# Patient Record
Sex: Female | Born: 1980 | Race: White | Hispanic: No | Smoking: Current some day smoker
Health system: Southern US, Community
[De-identification: ages and names within clinical notes are randomized; demographics above are authoritative.]

## PROBLEM LIST (undated history)

## (undated) DIAGNOSIS — F1010 Alcohol abuse, uncomplicated: Secondary | ICD-10-CM

## (undated) DIAGNOSIS — F419 Anxiety disorder, unspecified: Secondary | ICD-10-CM

## (undated) DIAGNOSIS — F1910 Other psychoactive substance abuse, uncomplicated: Secondary | ICD-10-CM

## (undated) HISTORY — PX: TONSILLECTOMY: SUR1361

---

## 2000-06-01 ENCOUNTER — Encounter: Payer: Self-pay | Admitting: Obstetrics

## 2000-06-01 ENCOUNTER — Inpatient Hospital Stay (HOSPITAL_COMMUNITY): Admission: AD | Admit: 2000-06-01 | Discharge: 2000-06-01 | Payer: Self-pay | Admitting: Obstetrics

## 2001-02-03 ENCOUNTER — Inpatient Hospital Stay (HOSPITAL_COMMUNITY): Admission: AD | Admit: 2001-02-03 | Discharge: 2001-02-03 | Payer: Self-pay | Admitting: Obstetrics & Gynecology

## 2001-02-03 ENCOUNTER — Encounter: Payer: Self-pay | Admitting: Obstetrics and Gynecology

## 2001-02-05 ENCOUNTER — Inpatient Hospital Stay (HOSPITAL_COMMUNITY): Admission: AD | Admit: 2001-02-05 | Discharge: 2001-02-05 | Payer: Self-pay | Admitting: Obstetrics & Gynecology

## 2001-02-06 ENCOUNTER — Inpatient Hospital Stay (HOSPITAL_COMMUNITY): Admission: AD | Admit: 2001-02-06 | Discharge: 2001-02-09 | Payer: Self-pay | Admitting: Obstetrics & Gynecology

## 2001-03-21 ENCOUNTER — Other Ambulatory Visit: Admission: RE | Admit: 2001-03-21 | Discharge: 2001-03-21 | Payer: Self-pay | Admitting: Obstetrics and Gynecology

## 2002-11-12 ENCOUNTER — Other Ambulatory Visit: Admission: RE | Admit: 2002-11-12 | Discharge: 2002-11-12 | Payer: Self-pay | Admitting: Obstetrics and Gynecology

## 2004-09-10 ENCOUNTER — Other Ambulatory Visit: Admission: RE | Admit: 2004-09-10 | Discharge: 2004-09-10 | Payer: Self-pay | Admitting: Obstetrics and Gynecology

## 2004-09-11 ENCOUNTER — Other Ambulatory Visit: Admission: RE | Admit: 2004-09-11 | Discharge: 2004-09-11 | Payer: Self-pay | Admitting: Obstetrics and Gynecology

## 2004-09-12 ENCOUNTER — Emergency Department (HOSPITAL_COMMUNITY): Admission: EM | Admit: 2004-09-12 | Discharge: 2004-09-12 | Payer: Self-pay | Admitting: Emergency Medicine

## 2004-09-17 ENCOUNTER — Encounter: Admission: RE | Admit: 2004-09-17 | Discharge: 2004-09-17 | Payer: Self-pay | Admitting: Gastroenterology

## 2005-05-16 ENCOUNTER — Inpatient Hospital Stay (HOSPITAL_COMMUNITY): Admission: AD | Admit: 2005-05-16 | Discharge: 2005-05-16 | Payer: Self-pay | Admitting: Obstetrics and Gynecology

## 2006-01-22 ENCOUNTER — Inpatient Hospital Stay (HOSPITAL_COMMUNITY): Admission: AD | Admit: 2006-01-22 | Discharge: 2006-01-22 | Payer: Self-pay | Admitting: Obstetrics & Gynecology

## 2006-01-25 ENCOUNTER — Inpatient Hospital Stay (HOSPITAL_COMMUNITY): Admission: AD | Admit: 2006-01-25 | Discharge: 2006-01-25 | Payer: Self-pay | Admitting: Obstetrics and Gynecology

## 2006-01-27 ENCOUNTER — Inpatient Hospital Stay (HOSPITAL_COMMUNITY): Admission: AD | Admit: 2006-01-27 | Discharge: 2006-01-28 | Payer: Self-pay | Admitting: Obstetrics and Gynecology

## 2006-02-04 ENCOUNTER — Inpatient Hospital Stay (HOSPITAL_COMMUNITY): Admission: AD | Admit: 2006-02-04 | Discharge: 2006-02-04 | Payer: Self-pay | Admitting: Obstetrics and Gynecology

## 2006-02-23 ENCOUNTER — Other Ambulatory Visit: Admission: RE | Admit: 2006-02-23 | Discharge: 2006-02-23 | Payer: Self-pay | Admitting: Gynecology

## 2006-08-11 ENCOUNTER — Other Ambulatory Visit: Admission: RE | Admit: 2006-08-11 | Discharge: 2006-08-11 | Payer: Self-pay | Admitting: Gynecology

## 2006-11-28 ENCOUNTER — Emergency Department (HOSPITAL_COMMUNITY): Admission: EM | Admit: 2006-11-28 | Discharge: 2006-11-28 | Payer: Self-pay | Admitting: Emergency Medicine

## 2006-12-19 ENCOUNTER — Other Ambulatory Visit: Admission: RE | Admit: 2006-12-19 | Discharge: 2006-12-19 | Payer: Self-pay | Admitting: Gynecology

## 2007-04-09 ENCOUNTER — Emergency Department (HOSPITAL_COMMUNITY): Admission: EM | Admit: 2007-04-09 | Discharge: 2007-04-09 | Payer: Self-pay | Admitting: Emergency Medicine

## 2007-06-13 ENCOUNTER — Other Ambulatory Visit: Admission: RE | Admit: 2007-06-13 | Discharge: 2007-06-13 | Payer: Self-pay | Admitting: Gynecology

## 2007-11-26 ENCOUNTER — Emergency Department (HOSPITAL_COMMUNITY): Admission: EM | Admit: 2007-11-26 | Discharge: 2007-11-27 | Payer: Self-pay | Admitting: Emergency Medicine

## 2007-11-30 ENCOUNTER — Ambulatory Visit (HOSPITAL_COMMUNITY): Admission: RE | Admit: 2007-11-30 | Discharge: 2007-11-30 | Payer: Self-pay | Admitting: Family Medicine

## 2008-01-04 ENCOUNTER — Other Ambulatory Visit: Admission: RE | Admit: 2008-01-04 | Discharge: 2008-01-04 | Payer: Self-pay | Admitting: Gynecology

## 2008-01-17 ENCOUNTER — Emergency Department (HOSPITAL_COMMUNITY): Admission: EM | Admit: 2008-01-17 | Discharge: 2008-01-17 | Payer: Self-pay | Admitting: Emergency Medicine

## 2008-02-16 ENCOUNTER — Emergency Department (HOSPITAL_COMMUNITY): Admission: EM | Admit: 2008-02-16 | Discharge: 2008-02-16 | Payer: Self-pay | Admitting: Emergency Medicine

## 2008-04-17 ENCOUNTER — Emergency Department (HOSPITAL_BASED_OUTPATIENT_CLINIC_OR_DEPARTMENT_OTHER): Admission: EM | Admit: 2008-04-17 | Discharge: 2008-04-17 | Payer: Self-pay | Admitting: Emergency Medicine

## 2008-05-03 ENCOUNTER — Ambulatory Visit: Admission: RE | Admit: 2008-05-03 | Discharge: 2008-05-03 | Payer: Self-pay | Admitting: Obstetrics and Gynecology

## 2008-05-08 ENCOUNTER — Inpatient Hospital Stay (HOSPITAL_COMMUNITY): Admission: AD | Admit: 2008-05-08 | Discharge: 2008-05-08 | Payer: Self-pay | Admitting: Obstetrics and Gynecology

## 2008-06-03 ENCOUNTER — Inpatient Hospital Stay (HOSPITAL_COMMUNITY): Admission: AD | Admit: 2008-06-03 | Discharge: 2008-06-04 | Payer: Self-pay | Admitting: Obstetrics and Gynecology

## 2008-07-19 ENCOUNTER — Inpatient Hospital Stay (HOSPITAL_COMMUNITY): Admission: AD | Admit: 2008-07-19 | Discharge: 2008-07-19 | Payer: Self-pay | Admitting: Obstetrics and Gynecology

## 2008-08-10 ENCOUNTER — Inpatient Hospital Stay (HOSPITAL_COMMUNITY): Admission: AD | Admit: 2008-08-10 | Discharge: 2008-08-10 | Payer: Self-pay | Admitting: Obstetrics & Gynecology

## 2008-08-20 ENCOUNTER — Inpatient Hospital Stay (HOSPITAL_COMMUNITY): Admission: AD | Admit: 2008-08-20 | Discharge: 2008-08-20 | Payer: Self-pay | Admitting: Obstetrics and Gynecology

## 2008-09-18 ENCOUNTER — Inpatient Hospital Stay (HOSPITAL_COMMUNITY): Admission: AD | Admit: 2008-09-18 | Discharge: 2008-09-18 | Payer: Self-pay | Admitting: Obstetrics and Gynecology

## 2008-09-19 ENCOUNTER — Inpatient Hospital Stay (HOSPITAL_COMMUNITY): Admission: AD | Admit: 2008-09-19 | Discharge: 2008-09-20 | Payer: Self-pay | Admitting: Obstetrics and Gynecology

## 2008-09-21 ENCOUNTER — Inpatient Hospital Stay (HOSPITAL_COMMUNITY): Admission: AD | Admit: 2008-09-21 | Discharge: 2008-09-23 | Payer: Self-pay | Admitting: Obstetrics and Gynecology

## 2008-11-04 ENCOUNTER — Inpatient Hospital Stay (HOSPITAL_COMMUNITY): Admission: AD | Admit: 2008-11-04 | Discharge: 2008-11-04 | Payer: Self-pay | Admitting: Obstetrics and Gynecology

## 2008-11-05 ENCOUNTER — Inpatient Hospital Stay (HOSPITAL_COMMUNITY): Admission: AD | Admit: 2008-11-05 | Discharge: 2008-11-05 | Payer: Self-pay | Admitting: Obstetrics and Gynecology

## 2008-11-16 ENCOUNTER — Observation Stay (HOSPITAL_COMMUNITY): Admission: AD | Admit: 2008-11-16 | Discharge: 2008-11-17 | Payer: Self-pay | Admitting: Obstetrics and Gynecology

## 2008-11-19 ENCOUNTER — Inpatient Hospital Stay (HOSPITAL_COMMUNITY): Admission: AD | Admit: 2008-11-19 | Discharge: 2008-11-19 | Payer: Self-pay | Admitting: Obstetrics and Gynecology

## 2008-11-23 ENCOUNTER — Inpatient Hospital Stay (HOSPITAL_COMMUNITY): Admission: AD | Admit: 2008-11-23 | Discharge: 2008-11-23 | Payer: Self-pay | Admitting: Obstetrics and Gynecology

## 2008-12-06 ENCOUNTER — Inpatient Hospital Stay (HOSPITAL_COMMUNITY): Admission: AD | Admit: 2008-12-06 | Discharge: 2008-12-06 | Payer: Self-pay | Admitting: Obstetrics & Gynecology

## 2008-12-07 ENCOUNTER — Inpatient Hospital Stay (HOSPITAL_COMMUNITY): Admission: AD | Admit: 2008-12-07 | Discharge: 2008-12-08 | Payer: Self-pay | Admitting: Obstetrics & Gynecology

## 2008-12-09 ENCOUNTER — Inpatient Hospital Stay (HOSPITAL_COMMUNITY): Admission: AD | Admit: 2008-12-09 | Discharge: 2008-12-09 | Payer: Self-pay | Admitting: Obstetrics and Gynecology

## 2008-12-17 ENCOUNTER — Inpatient Hospital Stay (HOSPITAL_COMMUNITY): Admission: AD | Admit: 2008-12-17 | Discharge: 2008-12-17 | Payer: Self-pay | Admitting: Obstetrics and Gynecology

## 2008-12-20 ENCOUNTER — Inpatient Hospital Stay (HOSPITAL_COMMUNITY): Admission: AD | Admit: 2008-12-20 | Discharge: 2008-12-22 | Payer: Self-pay | Admitting: Obstetrics and Gynecology

## 2009-09-15 ENCOUNTER — Emergency Department (HOSPITAL_BASED_OUTPATIENT_CLINIC_OR_DEPARTMENT_OTHER): Admission: EM | Admit: 2009-09-15 | Discharge: 2009-09-15 | Payer: Self-pay | Admitting: Emergency Medicine

## 2009-11-14 ENCOUNTER — Emergency Department (HOSPITAL_BASED_OUTPATIENT_CLINIC_OR_DEPARTMENT_OTHER): Admission: EM | Admit: 2009-11-14 | Discharge: 2009-11-14 | Payer: Self-pay | Admitting: Emergency Medicine

## 2009-11-14 ENCOUNTER — Ambulatory Visit: Payer: Self-pay | Admitting: Diagnostic Radiology

## 2010-02-22 ENCOUNTER — Emergency Department (HOSPITAL_BASED_OUTPATIENT_CLINIC_OR_DEPARTMENT_OTHER): Admission: EM | Admit: 2010-02-22 | Discharge: 2010-02-22 | Payer: Self-pay | Admitting: Emergency Medicine

## 2010-03-28 ENCOUNTER — Emergency Department (HOSPITAL_BASED_OUTPATIENT_CLINIC_OR_DEPARTMENT_OTHER): Admission: EM | Admit: 2010-03-28 | Discharge: 2010-03-28 | Payer: Self-pay | Admitting: Emergency Medicine

## 2010-04-29 ENCOUNTER — Ambulatory Visit: Payer: Self-pay | Admitting: Diagnostic Radiology

## 2010-04-29 ENCOUNTER — Emergency Department (HOSPITAL_BASED_OUTPATIENT_CLINIC_OR_DEPARTMENT_OTHER): Admission: EM | Admit: 2010-04-29 | Discharge: 2010-04-29 | Payer: Self-pay | Admitting: Emergency Medicine

## 2010-05-25 ENCOUNTER — Emergency Department (HOSPITAL_BASED_OUTPATIENT_CLINIC_OR_DEPARTMENT_OTHER)
Admission: EM | Admit: 2010-05-25 | Discharge: 2010-05-25 | Payer: Self-pay | Source: Home / Self Care | Admitting: Emergency Medicine

## 2010-08-10 ENCOUNTER — Emergency Department (HOSPITAL_BASED_OUTPATIENT_CLINIC_OR_DEPARTMENT_OTHER)
Admission: EM | Admit: 2010-08-10 | Discharge: 2010-08-10 | Payer: Self-pay | Source: Home / Self Care | Admitting: Emergency Medicine

## 2010-10-06 ENCOUNTER — Emergency Department (INDEPENDENT_AMBULATORY_CARE_PROVIDER_SITE_OTHER): Payer: Self-pay

## 2010-10-06 ENCOUNTER — Emergency Department (HOSPITAL_BASED_OUTPATIENT_CLINIC_OR_DEPARTMENT_OTHER)
Admission: EM | Admit: 2010-10-06 | Discharge: 2010-10-06 | Disposition: A | Discharge status: Home / Self Care | Payer: Self-pay | Attending: Emergency Medicine | Admitting: Emergency Medicine

## 2010-10-06 DIAGNOSIS — S9030XA Contusion of unspecified foot, initial encounter: Secondary | ICD-10-CM | POA: Insufficient documentation

## 2010-10-06 DIAGNOSIS — W2203XA Walked into furniture, initial encounter: Secondary | ICD-10-CM | POA: Insufficient documentation

## 2010-10-06 DIAGNOSIS — M79609 Pain in unspecified limb: Secondary | ICD-10-CM

## 2010-10-06 DIAGNOSIS — Y92009 Unspecified place in unspecified non-institutional (private) residence as the place of occurrence of the external cause: Secondary | ICD-10-CM | POA: Insufficient documentation

## 2010-10-06 DIAGNOSIS — M25579 Pain in unspecified ankle and joints of unspecified foot: Secondary | ICD-10-CM | POA: Insufficient documentation

## 2010-10-06 DIAGNOSIS — F411 Generalized anxiety disorder: Secondary | ICD-10-CM | POA: Insufficient documentation

## 2010-10-21 LAB — URINE MICROSCOPIC-ADD ON

## 2010-10-21 LAB — GC/CHLAMYDIA PROBE AMP, GENITAL: Chlamydia, DNA Probe: NEGATIVE

## 2010-10-21 LAB — WET PREP, GENITAL

## 2010-10-21 LAB — URINALYSIS, ROUTINE W REFLEX MICROSCOPIC
Bilirubin Urine: NEGATIVE
Hgb urine dipstick: NEGATIVE
Specific Gravity, Urine: 1.016 (ref 1.005–1.030)
Urobilinogen, UA: 0.2 mg/dL (ref 0.0–1.0)

## 2010-10-28 LAB — WET PREP, GENITAL
Clue Cells Wet Prep HPF POC: NONE SEEN
Trich, Wet Prep: NONE SEEN
Yeast Wet Prep HPF POC: NONE SEEN

## 2010-10-28 LAB — URINALYSIS, ROUTINE W REFLEX MICROSCOPIC
Glucose, UA: NEGATIVE mg/dL
Ketones, ur: NEGATIVE mg/dL
Specific Gravity, Urine: 1.023 (ref 1.005–1.030)
pH: 7 (ref 5.0–8.0)

## 2010-10-29 LAB — URINALYSIS, ROUTINE W REFLEX MICROSCOPIC
Bilirubin Urine: NEGATIVE
Hgb urine dipstick: NEGATIVE
Ketones, ur: 40 mg/dL — AB
Protein, ur: NEGATIVE mg/dL
Urobilinogen, UA: 0.2 mg/dL (ref 0.0–1.0)

## 2010-11-17 LAB — CBC
HCT: 30.7 % — ABNORMAL LOW (ref 36.0–46.0)
Hemoglobin: 12.6 g/dL (ref 12.0–15.0)
MCHC: 34.7 g/dL (ref 30.0–36.0)
MCV: 96.4 fL (ref 78.0–100.0)
Platelets: 121 10*3/uL — ABNORMAL LOW (ref 150–400)
Platelets: 138 10*3/uL — ABNORMAL LOW (ref 150–400)
RBC: 3.71 MIL/uL — ABNORMAL LOW (ref 3.87–5.11)
RDW: 13.6 % (ref 11.5–15.5)
RDW: 13.7 % (ref 11.5–15.5)
WBC: 10.4 10*3/uL (ref 4.0–10.5)

## 2010-11-17 LAB — RPR
RPR Ser Ql: NONREACTIVE
RPR Ser Ql: NONREACTIVE

## 2010-11-18 LAB — COMPREHENSIVE METABOLIC PANEL
Albumin: 2.5 g/dL — ABNORMAL LOW (ref 3.5–5.2)
Alkaline Phosphatase: 65 U/L (ref 39–117)
BUN: 3 mg/dL — ABNORMAL LOW (ref 6–23)
Calcium: 8.1 mg/dL — ABNORMAL LOW (ref 8.4–10.5)
Creatinine, Ser: 0.59 mg/dL (ref 0.4–1.2)
Glucose, Bld: 140 mg/dL — ABNORMAL HIGH (ref 70–99)
Potassium: 3.2 mEq/L — ABNORMAL LOW (ref 3.5–5.1)
Total Protein: 5 g/dL — ABNORMAL LOW (ref 6.0–8.3)

## 2010-11-18 LAB — URINALYSIS, ROUTINE W REFLEX MICROSCOPIC
Glucose, UA: NEGATIVE mg/dL
Ketones, ur: NEGATIVE mg/dL
Nitrite: NEGATIVE
Protein, ur: NEGATIVE mg/dL

## 2010-11-18 LAB — CBC
HCT: 32.3 % — ABNORMAL LOW (ref 36.0–46.0)
HCT: 33.5 % — ABNORMAL LOW (ref 36.0–46.0)
Hemoglobin: 11.1 g/dL — ABNORMAL LOW (ref 12.0–15.0)
Hemoglobin: 11.7 g/dL — ABNORMAL LOW (ref 12.0–15.0)
MCHC: 34.2 g/dL (ref 30.0–36.0)
MCV: 95.8 fL (ref 78.0–100.0)
Platelets: 158 10*3/uL (ref 150–400)
Platelets: 164 10*3/uL (ref 150–400)
RDW: 13 % (ref 11.5–15.5)
WBC: 8.8 10*3/uL (ref 4.0–10.5)

## 2010-11-18 LAB — STREP B DNA PROBE

## 2010-11-18 LAB — URIC ACID: Uric Acid, Serum: 4.7 mg/dL (ref 2.4–7.0)

## 2010-11-19 LAB — COMPREHENSIVE METABOLIC PANEL
AST: 17 U/L (ref 0–37)
Alkaline Phosphatase: 64 U/L (ref 39–117)
CO2: 23 mEq/L (ref 19–32)
Chloride: 108 mEq/L (ref 96–112)
Creatinine, Ser: 0.45 mg/dL (ref 0.4–1.2)
GFR calc Af Amer: >60 mL/min (ref >60)
GFR calc non Af Amer: >60 mL/min (ref >60)
Potassium: 3.7 mEq/L (ref 3.5–5.1)
Total Bilirubin: 0.6 mg/dL (ref 0.3–1.2)

## 2010-11-19 LAB — URINALYSIS, ROUTINE W REFLEX MICROSCOPIC
Bilirubin Urine: NEGATIVE
Glucose, UA: NEGATIVE mg/dL
Hgb urine dipstick: NEGATIVE
Ketones, ur: 40 mg/dL — AB
Protein, ur: NEGATIVE mg/dL

## 2010-11-19 LAB — CBC
HCT: 34.3 % — ABNORMAL LOW (ref 36.0–46.0)
MCV: 95.3 fL (ref 78.0–100.0)
RBC: 3.6 MIL/uL — ABNORMAL LOW (ref 3.87–5.11)
WBC: 8.9 10*3/uL (ref 4.0–10.5)

## 2010-11-23 LAB — URINALYSIS, ROUTINE W REFLEX MICROSCOPIC
Bilirubin Urine: NEGATIVE
Glucose, UA: NEGATIVE mg/dL
Hgb urine dipstick: NEGATIVE
Ketones, ur: NEGATIVE mg/dL
Specific Gravity, Urine: 1.01 (ref 1.005–1.030)
pH: 7.5 (ref 5.0–8.0)

## 2010-11-23 LAB — WET PREP, GENITAL: Trich, Wet Prep: NONE SEEN

## 2010-11-24 LAB — URINALYSIS, ROUTINE W REFLEX MICROSCOPIC
Bilirubin Urine: NEGATIVE
Glucose, UA: NEGATIVE mg/dL
Glucose, UA: NEGATIVE mg/dL
Hgb urine dipstick: NEGATIVE
Hgb urine dipstick: NEGATIVE
Ketones, ur: NEGATIVE mg/dL
Protein, ur: NEGATIVE mg/dL
Specific Gravity, Urine: 1.025 (ref 1.005–1.030)
Specific Gravity, Urine: <1.005 — ABNORMAL LOW (ref 1.005–1.030)
Urobilinogen, UA: 0.2 mg/dL (ref 0.0–1.0)
Urobilinogen, UA: 0.2 mg/dL (ref 0.0–1.0)
pH: 6.5 (ref 5.0–8.0)

## 2010-11-24 LAB — DIC (DISSEMINATED INTRAVASCULAR COAGULATION)PANEL
D-Dimer, Quant: 0.61 ug/mL-FEU — ABNORMAL HIGH (ref 0.00–0.48)
Fibrinogen: 374 mg/dL (ref 204–475)
Platelets: 150 10*3/uL (ref 150–400)

## 2010-11-24 LAB — CBC
HCT: 32.6 % — ABNORMAL LOW (ref 36.0–46.0)
HCT: 34 % — ABNORMAL LOW (ref 36.0–46.0)
Hemoglobin: 11.6 g/dL — ABNORMAL LOW (ref 12.0–15.0)
MCV: 96.1 fL (ref 78.0–100.0)
Platelets: 141 10*3/uL — ABNORMAL LOW (ref 150–400)
RBC: 3.56 MIL/uL — ABNORMAL LOW (ref 3.87–5.11)
RDW: 13.7 % (ref 11.5–15.5)
RDW: 13.8 % (ref 11.5–15.5)
WBC: 10.2 10*3/uL (ref 4.0–10.5)
WBC: 9.6 10*3/uL (ref 4.0–10.5)

## 2010-11-24 LAB — COMPREHENSIVE METABOLIC PANEL
ALT: 13 U/L (ref 0–35)
AST: 21 U/L (ref 0–37)
Albumin: 2.8 g/dL — ABNORMAL LOW (ref 3.5–5.2)
Alkaline Phosphatase: 46 U/L (ref 39–117)
BUN: 4 mg/dL — ABNORMAL LOW (ref 6–23)
BUN: 4 mg/dL — ABNORMAL LOW (ref 6–23)
Chloride: 106 mEq/L (ref 96–112)
Chloride: 108 mEq/L (ref 96–112)
Creatinine, Ser: 0.5 mg/dL (ref 0.4–1.2)
GFR calc Af Amer: >60 mL/min (ref >60)
Glucose, Bld: 90 mg/dL (ref 70–99)
Potassium: 2.8 mEq/L — ABNORMAL LOW (ref 3.5–5.1)
Potassium: 3.5 mEq/L (ref 3.5–5.1)
Sodium: 137 mEq/L (ref 135–145)
Total Bilirubin: 0.3 mg/dL (ref 0.3–1.2)
Total Bilirubin: 0.4 mg/dL (ref 0.3–1.2)
Total Protein: 5.2 g/dL — ABNORMAL LOW (ref 6.0–8.3)
Total Protein: 5.4 g/dL — ABNORMAL LOW (ref 6.0–8.3)

## 2010-11-24 LAB — STREP B DNA PROBE: Strep Group B Ag: NEGATIVE

## 2010-11-24 LAB — FETAL FIBRONECTIN: Fetal Fibronectin: NEGATIVE

## 2010-12-25 NOTE — Discharge Summary (Signed)
NAMESHANNIE, KONTOS            ACCOUNT NO.:  [id number]   MEDICAL RECORD NO.:  [id number]          PATIENT TYPE:  INP   LOCATION:  9154                          FACILITY:  [facility]   PHYSICIAN:  Zelphia Cairo, MD    DATE OF BIRTH:  Sep 25, 1980   DATE OF ADMISSION:  09/21/2008  DATE OF DISCHARGE:  09/23/2008                               DISCHARGE SUMMARY   ADMITTING DIAGNOSES:  1. Intrauterine pregnancy at 26-3/7th weeks' estimated gestational      age.  2. Preterm labor.   DISCHARGE DIAGNOSES:  1. Intrauterine pregnancy at 26-3/7th weeks' estimated gestational      age.  2. Preterm labor, tocolysed.   REASON FOR ADMISSION:  Please see written H and P.   HOSPITAL COURSE:  The patient is a 30 year old white married female,  gravida 5, para 1, who was admitted to [facility] at 15-  3/7th weeks' estimated gestational age with preterm labor.  The patient  had been observed in the MAU unit where she had noted some increasing  contractions and some vaginal spotting.  The patient had history of  preterm labor and had been managed at home with oral terbutaline.  On  admission, vital signs were stable.  Cervix was examined and found to be  closed.  An ultrasound had been performed which revealed cervical length  at 3.8 cm.  Fetal heart tones were reactive, and contractions were noted  to be approximately every 6 minutes.  The patient was now started on  magnesium sulfate for tocolyzation of her labor.  Group B beta strep  culture was obtained, and the patient was started on betamethasone for  maturity of fetal lungs.  On the following morning, the patient denied  contractions.  She continued on magnesium sulfate at 2 g per hour.  Magnesium sulfate was beginning to be tapered, and the patient was  restarted on oral terbutaline.  On the following morning, the patient  denied any further contractions, vaginal bleeding, or loss of fluid.  Fetal heart tones continued to be  reactive.  Abdomen was soft.  Cervical  exam was deferred.  Magnesium sulfate was totally discontinued, and the  patient was later discharged home.   CONDITION ON DISCHARGE:  Stable.   DIET:  Regular as tolerated.   ACTIVITY:  Modified bedrest.  The patient is to call for increasing  pelvic pressure, vaginal bleeding, increasing contractions, decreasing  fetal movement, loss of amniotic fluid.   FOLLOW UP:  The patient to follow up in the office in 2-3 days for  ultrasound for cervical length and OB check.   DISCHARGE MEDICATION:  1. Terbutaline 2.5 mg every 4 hours as needed.  2. Prenatal vitamins 1 p.o. daily.      Julio Sicks, N.P.      Zelphia Cairo, MD  Electronically Signed    CC/MEDQ  D:  10/14/2008  T:  10/14/2008  Job:  [id number]

## 2011-01-07 ENCOUNTER — Emergency Department (HOSPITAL_BASED_OUTPATIENT_CLINIC_OR_DEPARTMENT_OTHER)
Admission: EM | Admit: 2011-01-07 | Discharge: 2011-01-07 | Disposition: A | Discharge status: Home / Self Care | Payer: Self-pay | Attending: Emergency Medicine | Admitting: Emergency Medicine

## 2011-01-07 ENCOUNTER — Emergency Department (INDEPENDENT_AMBULATORY_CARE_PROVIDER_SITE_OTHER): Payer: Self-pay

## 2011-01-07 DIAGNOSIS — R51 Headache: Secondary | ICD-10-CM

## 2011-01-07 DIAGNOSIS — J323 Chronic sphenoidal sinusitis: Secondary | ICD-10-CM | POA: Insufficient documentation

## 2011-01-07 DIAGNOSIS — J329 Chronic sinusitis, unspecified: Secondary | ICD-10-CM

## 2011-01-07 DIAGNOSIS — M542 Cervicalgia: Secondary | ICD-10-CM | POA: Insufficient documentation

## 2011-01-07 DIAGNOSIS — R209 Unspecified disturbances of skin sensation: Secondary | ICD-10-CM | POA: Insufficient documentation

## 2011-01-07 DIAGNOSIS — R55 Syncope and collapse: Secondary | ICD-10-CM | POA: Insufficient documentation

## 2011-01-07 DIAGNOSIS — Y92009 Unspecified place in unspecified non-institutional (private) residence as the place of occurrence of the external cause: Secondary | ICD-10-CM | POA: Insufficient documentation

## 2011-01-07 DIAGNOSIS — W19XXXA Unspecified fall, initial encounter: Secondary | ICD-10-CM | POA: Insufficient documentation

## 2011-01-07 DIAGNOSIS — S060X1A Concussion with loss of consciousness of 30 minutes or less, initial encounter: Secondary | ICD-10-CM | POA: Insufficient documentation

## 2011-01-07 DIAGNOSIS — R42 Dizziness and giddiness: Secondary | ICD-10-CM | POA: Insufficient documentation

## 2011-01-07 LAB — URINE MICROSCOPIC-ADD ON

## 2011-01-07 LAB — BASIC METABOLIC PANEL
BUN: 8 mg/dL (ref 6–23)
Chloride: 104 mEq/L (ref 96–112)
Potassium: 3.9 mEq/L (ref 3.5–5.1)
Sodium: 140 mEq/L (ref 135–145)

## 2011-01-07 LAB — GLUCOSE, CAPILLARY: Glucose-Capillary: 82 mg/dL (ref 70–99)

## 2011-01-07 LAB — URINALYSIS, ROUTINE W REFLEX MICROSCOPIC
Bilirubin Urine: NEGATIVE
Leukocytes, UA: NEGATIVE
Nitrite: NEGATIVE
Specific Gravity, Urine: 1.015 (ref 1.005–1.030)
Urobilinogen, UA: 0.2 mg/dL (ref 0.0–1.0)
pH: 7.5 (ref 5.0–8.0)

## 2011-01-07 LAB — DIFFERENTIAL
Basophils Relative: 1 % (ref 0–1)
Eosinophils Absolute: 0.1 10*3/uL (ref 0.0–0.7)
Eosinophils Relative: 1 % (ref 0–5)
Lymphs Abs: 1.4 10*3/uL (ref 0.7–4.0)
Monocytes Relative: 7 % (ref 3–12)

## 2011-01-07 LAB — CBC
MCH: 32.3 pg (ref 26.0–34.0)
MCV: 95.9 fL (ref 78.0–100.0)
Platelets: 150 10*3/uL (ref 150–400)
RDW: 12.4 % (ref 11.5–15.5)

## 2011-01-07 LAB — PREGNANCY, URINE: Preg Test, Ur: NEGATIVE

## 2011-02-07 ENCOUNTER — Emergency Department (HOSPITAL_COMMUNITY)
Admission: EM | Admit: 2011-02-07 | Discharge: 2011-02-07 | Disposition: A | Discharge status: Other Acute Inpatient Hospital | Payer: No Typology Code available for payment source | Attending: Emergency Medicine | Admitting: Emergency Medicine

## 2011-02-07 DIAGNOSIS — R109 Unspecified abdominal pain: Secondary | ICD-10-CM | POA: Insufficient documentation

## 2011-02-07 DIAGNOSIS — IMO0002 Reserved for concepts with insufficient information to code with codable children: Secondary | ICD-10-CM | POA: Insufficient documentation

## 2011-02-07 LAB — URINALYSIS, ROUTINE W REFLEX MICROSCOPIC
Bilirubin Urine: NEGATIVE
Glucose, UA: NEGATIVE mg/dL
Hgb urine dipstick: NEGATIVE
Ketones, ur: NEGATIVE mg/dL
Protein, ur: NEGATIVE mg/dL
pH: 5.5 (ref 5.0–8.0)

## 2011-02-12 ENCOUNTER — Emergency Department (INDEPENDENT_AMBULATORY_CARE_PROVIDER_SITE_OTHER): Payer: Self-pay

## 2011-02-12 ENCOUNTER — Emergency Department (HOSPITAL_BASED_OUTPATIENT_CLINIC_OR_DEPARTMENT_OTHER)
Admission: EM | Admit: 2011-02-12 | Discharge: 2011-02-12 | Disposition: A | Discharge status: Home / Self Care | Payer: Self-pay | Attending: Emergency Medicine | Admitting: Emergency Medicine

## 2011-02-12 DIAGNOSIS — N83209 Unspecified ovarian cyst, unspecified side: Secondary | ICD-10-CM

## 2011-02-12 DIAGNOSIS — R109 Unspecified abdominal pain: Secondary | ICD-10-CM

## 2011-02-12 LAB — URINALYSIS, ROUTINE W REFLEX MICROSCOPIC
Nitrite: NEGATIVE
Specific Gravity, Urine: 1.023 (ref 1.005–1.030)
Urobilinogen, UA: 1 mg/dL (ref 0.0–1.0)
pH: 6 (ref 5.0–8.0)

## 2011-02-12 LAB — CBC
HCT: 41.8 % (ref 36.0–46.0)
Hemoglobin: 14.1 g/dL (ref 12.0–15.0)
MCHC: 33.7 g/dL (ref 30.0–36.0)
RDW: 12.2 % (ref 11.5–15.5)
WBC: 6.7 10*3/uL (ref 4.0–10.5)

## 2011-02-12 LAB — COMPREHENSIVE METABOLIC PANEL
ALT: 12 U/L (ref 0–35)
Albumin: 3.8 g/dL (ref 3.5–5.2)
Alkaline Phosphatase: 47 U/L (ref 39–117)
BUN: 10 mg/dL (ref 6–23)
Chloride: 103 mEq/L (ref 96–112)
Potassium: 3.8 mEq/L (ref 3.5–5.1)
Sodium: 138 mEq/L (ref 135–145)
Total Bilirubin: 0.2 mg/dL — ABNORMAL LOW (ref 0.3–1.2)
Total Protein: 6.6 g/dL (ref 6.0–8.3)

## 2011-02-12 LAB — DIFFERENTIAL
Basophils Absolute: 0.1 10*3/uL (ref 0.0–0.1)
Basophils Relative: 1 % (ref 0–1)
Eosinophils Relative: 2 % (ref 0–5)
Lymphocytes Relative: 24 % (ref 12–46)
Monocytes Absolute: 0.7 10*3/uL (ref 0.1–1.0)
Neutro Abs: 4.3 10*3/uL (ref 1.7–7.7)

## 2011-02-12 LAB — URINE MICROSCOPIC-ADD ON

## 2011-02-23 ENCOUNTER — Emergency Department (HOSPITAL_COMMUNITY)
Admission: EM | Admit: 2011-02-23 | Discharge: 2011-02-23 | Disposition: A | Discharge status: Home / Self Care | Payer: Self-pay | Attending: Emergency Medicine | Admitting: Emergency Medicine

## 2011-02-23 DIAGNOSIS — N898 Other specified noninflammatory disorders of vagina: Secondary | ICD-10-CM | POA: Insufficient documentation

## 2011-02-23 LAB — WET PREP, GENITAL: Clue Cells Wet Prep HPF POC: NONE SEEN

## 2011-02-24 LAB — GONOCOCCUS DNA, PCR: GC Probe Amp, Genital: NEGATIVE

## 2011-03-10 ENCOUNTER — Ambulatory Visit: Payer: Self-pay | Admitting: Obstetrics and Gynecology

## 2011-03-14 ENCOUNTER — Encounter: Payer: Self-pay | Admitting: Emergency Medicine

## 2011-03-14 ENCOUNTER — Emergency Department (HOSPITAL_BASED_OUTPATIENT_CLINIC_OR_DEPARTMENT_OTHER)
Admission: EM | Admit: 2011-03-14 | Discharge: 2011-03-14 | Disposition: A | Discharge status: Home / Self Care | Payer: Self-pay | Attending: Emergency Medicine | Admitting: Emergency Medicine

## 2011-03-14 DIAGNOSIS — T6391XA Toxic effect of contact with unspecified venomous animal, accidental (unintentional), initial encounter: Secondary | ICD-10-CM | POA: Insufficient documentation

## 2011-03-14 DIAGNOSIS — T63461A Toxic effect of venom of wasps, accidental (unintentional), initial encounter: Secondary | ICD-10-CM | POA: Insufficient documentation

## 2011-03-14 DIAGNOSIS — T63441A Toxic effect of venom of bees, accidental (unintentional), initial encounter: Secondary | ICD-10-CM

## 2011-03-14 MED ORDER — HYDROCODONE-ACETAMINOPHEN 5-325 MG PO TABS
1.0000 | ORAL_TABLET | Freq: Once | ORAL | Status: AC
  Administered 2011-03-14: 1 via ORAL
  Filled 2011-03-14: qty 1

## 2011-03-14 MED ORDER — DIPHENHYDRAMINE HCL 25 MG PO CAPS
25.0000 mg | ORAL_CAPSULE | Freq: Once | ORAL | Status: AC
  Administered 2011-03-14: 25 mg via ORAL
  Filled 2011-03-14 (×2): qty 1

## 2011-03-14 MED ORDER — METHYLPREDNISOLONE SODIUM SUCC 125 MG IJ SOLR
125.0000 mg | Freq: Once | INTRAMUSCULAR | Status: AC
  Administered 2011-03-14: 125 mg via INTRAMUSCULAR
  Filled 2011-03-14: qty 2

## 2011-03-14 NOTE — ED Provider Notes (Signed)
History     CSN: [id number] Arrival date & time: 03/14/2011  8:59 PM  Chief Complaint  Patient presents with  . Insect Bite    pt reports painful bee sting to R hand with swelling and edema   HPI Comments: Pt states that she was stung on her right hand she is c/o swellling to the area  Patient is a 30 y.o. female presenting with allergic reaction. The history is provided by the patient. No language interpreter was used.  Allergic Reaction The primary symptoms do not include wheezing, shortness of breath, nausea, vomiting or altered mental status. The current episode started 3 to 5 hours ago. The problem has been gradually worsening.  The onset of the reaction was associated with insect bite/sting. Significant symptoms also include itching. Significant symptoms that are not present include eye redness, flushing or rhinorrhea.    History reviewed. No pertinent past medical history.  History reviewed. No pertinent past surgical history.  History reviewed. No pertinent family history.  History  Substance Use Topics  . Smoking status: Current Some Day Smoker  . Smokeless tobacco: Not on file  . Alcohol Use: 0.6 oz/week    1 Glasses of wine per week    OB History    Grav Para Term Preterm Abortions TAB SAB Ect Mult Living                  Review of Systems  HENT: Negative for rhinorrhea.   Eyes: Negative for redness.  Respiratory: Negative for shortness of breath and wheezing.   Gastrointestinal: Negative for nausea and vomiting.  Genitourinary: Negative.   Skin: Positive for itching. Negative for flushing.  Neurological: Negative.   Psychiatric/Behavioral: Negative for altered mental status.    Physical Exam  BP 110/55  Pulse 100  Temp(Src) 98 F (36.7 C) (Oral)  Resp 26  Physical Exam  Nursing note and vitals reviewed. Constitutional: She is oriented to person, place, and time. She appears well-developed and well-nourished.  HENT:  Head: Normocephalic and  atraumatic.  Neck: Normal range of motion.  Cardiovascular: Normal rate and regular rhythm.   Pulmonary/Chest: Effort normal and breath sounds normal.  Musculoskeletal: Normal range of motion.  Neurological: She is alert and oriented to person, place, and time.  Skin:     Psychiatric: She has a normal mood and affect.    ED Course  Procedures  MDM Pt having a localized reaction to beesting:pt not in any respiratory distress:pt okay to go home     Teressa Lower, NP 03/14/11 [phone]

## 2011-03-14 NOTE — ED Notes (Signed)
Pt presents with bee sting to R hand with edema to site

## 2011-03-15 ENCOUNTER — Emergency Department (HOSPITAL_BASED_OUTPATIENT_CLINIC_OR_DEPARTMENT_OTHER)
Admission: EM | Admit: 2011-03-15 | Discharge: 2011-03-15 | Disposition: A | Discharge status: Home / Self Care | Payer: Self-pay | Attending: Emergency Medicine | Admitting: Emergency Medicine

## 2011-03-15 ENCOUNTER — Encounter (HOSPITAL_BASED_OUTPATIENT_CLINIC_OR_DEPARTMENT_OTHER): Payer: Self-pay | Admitting: *Deleted

## 2011-03-15 DIAGNOSIS — T63441A Toxic effect of venom of bees, accidental (unintentional), initial encounter: Secondary | ICD-10-CM

## 2011-03-15 DIAGNOSIS — T6391XA Toxic effect of contact with unspecified venomous animal, accidental (unintentional), initial encounter: Secondary | ICD-10-CM | POA: Insufficient documentation

## 2011-03-15 DIAGNOSIS — T63461A Toxic effect of venom of wasps, accidental (unintentional), initial encounter: Secondary | ICD-10-CM | POA: Insufficient documentation

## 2011-03-15 MED ORDER — PREDNISONE 10 MG PO TABS: 20.0000 mg | ORAL_TABLET | Freq: Three times a day (TID) | ORAL | Status: DC

## 2011-03-15 MED ORDER — PREDNISONE 10 MG PO TABS: 20.0000 mg | ORAL_TABLET | Freq: Three times a day (TID) | ORAL | Status: AC

## 2011-03-15 MED ORDER — HYDROCODONE-ACETAMINOPHEN 5-325 MG PO TABS: 2.0000 | ORAL_TABLET | ORAL | Status: DC | PRN

## 2011-03-15 NOTE — ED Notes (Signed)
Bee string. Swelling today. Was seen yesterday and told to take Benadryl and Motrin.

## 2011-03-15 NOTE — ED Provider Notes (Deleted)
History     CSN: [id number] Arrival date & time: 03/14/2011  8:59 PM  Chief Complaint  Patient presents with  . Insect Bite    pt reports painful bee sting to R hand with swelling and edema   HPI  History reviewed. No pertinent past medical history.  History reviewed. No pertinent past surgical history.  History reviewed. No pertinent family history.  History  Substance Use Topics  . Smoking status: Current Some Day Smoker  . Smokeless tobacco: Not on file  . Alcohol Use: 0.6 oz/week    1 Glasses of wine per week    OB History    Grav Para Term Preterm Abortions TAB SAB Ect Mult Living                  Review of Systems  Physical Exam  BP 115/76  Pulse 78  Temp(Src) 98 F (36.7 C) (Oral)  Resp 16  SpO2 100%  Physical Exam  ED Course  Procedures  MDM       Juliet Rude. Rubin Payor, MD 03/15/11 7829
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
## 2011-03-15 NOTE — ED Provider Notes (Signed)
Medical screening examination/treatment/procedure(s) were performed by non-physician practitioner and as supervising physician I was immediately available for consultation/collaboration.   Juliet Rude. Rubin Payor, MD 03/15/11 1610

## 2011-03-16 NOTE — ED Provider Notes (Signed)
History     CSN: [id number] Arrival date & time: 03/15/2011  4:45 PM  Chief Complaint  Patient presents with  . Insect Bite   HPI  History reviewed. No pertinent past medical history.  Past Surgical History  Procedure Date  . Tonsillectomy     No family history on file.  History  Substance Use Topics  . Smoking status: Current Some Day Smoker  . Smokeless tobacco: Not on file  . Alcohol Use: 0.6 oz/week    1 Glasses of wine per week    OB History    Grav Para Term Preterm Abortions TAB SAB Ect Mult Living                  Review of Systems  Physical Exam  BP 123/73  Pulse 92  Temp(Src) 98.7 F (37.1 C) (Oral)  Resp 20  SpO2 100%  Physical Exam  ED Course  Procedures  MDM Medical screening examination/treatment/procedure(s) were performed by non-physician practitioner and as supervising physician I was immediately available for consultation/collaboration.      Forbes Cellar, MD 03/16/11 [phone]

## 2011-04-02 ENCOUNTER — Ambulatory Visit: Payer: Self-pay | Admitting: Obstetrics and Gynecology

## 2011-04-12 ENCOUNTER — Encounter (HOSPITAL_BASED_OUTPATIENT_CLINIC_OR_DEPARTMENT_OTHER): Payer: Self-pay | Admitting: *Deleted

## 2011-04-12 ENCOUNTER — Emergency Department (HOSPITAL_BASED_OUTPATIENT_CLINIC_OR_DEPARTMENT_OTHER)
Admission: EM | Admit: 2011-04-12 | Discharge: 2011-04-12 | Disposition: A | Discharge status: Home / Self Care | Payer: Self-pay | Attending: Emergency Medicine | Admitting: Emergency Medicine

## 2011-04-12 DIAGNOSIS — B9689 Other specified bacterial agents as the cause of diseases classified elsewhere: Secondary | ICD-10-CM | POA: Insufficient documentation

## 2011-04-12 DIAGNOSIS — N760 Acute vaginitis: Secondary | ICD-10-CM | POA: Insufficient documentation

## 2011-04-12 DIAGNOSIS — A499 Bacterial infection, unspecified: Secondary | ICD-10-CM | POA: Insufficient documentation

## 2011-04-12 DIAGNOSIS — F17200 Nicotine dependence, unspecified, uncomplicated: Secondary | ICD-10-CM | POA: Insufficient documentation

## 2011-04-12 LAB — URINE MICROSCOPIC-ADD ON

## 2011-04-12 LAB — URINALYSIS, ROUTINE W REFLEX MICROSCOPIC
Nitrite: NEGATIVE
Protein, ur: NEGATIVE mg/dL
Specific Gravity, Urine: 1.018 (ref 1.005–1.030)
Urobilinogen, UA: 0.2 mg/dL (ref 0.0–1.0)

## 2011-04-12 LAB — PREGNANCY, URINE: Preg Test, Ur: NEGATIVE

## 2011-04-12 LAB — WET PREP, GENITAL

## 2011-04-12 MED ORDER — METRONIDAZOLE 500 MG PO TABS: 500.0000 mg | ORAL_TABLET | Freq: Two times a day (BID) | ORAL | Status: AC
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
## 2011-04-12 NOTE — ED Notes (Signed)
Vaginal burning x 2 days. Nausea. Lower back pain. No discharge. Odor.

## 2011-04-13 LAB — GC/CHLAMYDIA PROBE AMP, GENITAL: Chlamydia, DNA Probe: NEGATIVE

## 2011-04-15 ENCOUNTER — Encounter (HOSPITAL_BASED_OUTPATIENT_CLINIC_OR_DEPARTMENT_OTHER): Payer: Self-pay

## 2011-04-15 ENCOUNTER — Emergency Department (INDEPENDENT_AMBULATORY_CARE_PROVIDER_SITE_OTHER): Payer: Self-pay

## 2011-04-15 ENCOUNTER — Emergency Department (HOSPITAL_BASED_OUTPATIENT_CLINIC_OR_DEPARTMENT_OTHER)
Admission: EM | Admit: 2011-04-15 | Discharge: 2011-04-15 | Disposition: A | Discharge status: Home / Self Care | Payer: Self-pay | Attending: Emergency Medicine | Admitting: Emergency Medicine

## 2011-04-15 DIAGNOSIS — Y92009 Unspecified place in unspecified non-institutional (private) residence as the place of occurrence of the external cause: Secondary | ICD-10-CM | POA: Insufficient documentation

## 2011-04-15 DIAGNOSIS — S139XXA Sprain of joints and ligaments of unspecified parts of neck, initial encounter: Secondary | ICD-10-CM | POA: Insufficient documentation

## 2011-04-15 DIAGNOSIS — F17200 Nicotine dependence, unspecified, uncomplicated: Secondary | ICD-10-CM | POA: Insufficient documentation

## 2011-04-15 DIAGNOSIS — S161XXA Strain of muscle, fascia and tendon at neck level, initial encounter: Secondary | ICD-10-CM

## 2011-04-15 DIAGNOSIS — M542 Cervicalgia: Secondary | ICD-10-CM

## 2011-04-15 MED ORDER — IBUPROFEN 400 MG PO TABS: 400.0000 mg | ORAL_TABLET | Freq: Four times a day (QID) | ORAL | Status: AC | PRN

## 2011-04-15 MED ORDER — IBUPROFEN 400 MG PO TABS
400.0000 mg | ORAL_TABLET | Freq: Once | ORAL | Status: AC
  Administered 2011-04-15: 400 mg via ORAL
  Filled 2011-04-15: qty 1

## 2011-04-15 MED ORDER — CYCLOBENZAPRINE HCL 10 MG PO TABS: 10.0000 mg | ORAL_TABLET | Freq: Three times a day (TID) | ORAL | Status: AC | PRN
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
## 2011-04-15 NOTE — ED Notes (Signed)
Pt states she is unable to void at this time, given spec cup and instructions for cc urine sample.
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
## 2011-04-16 ENCOUNTER — Emergency Department (HOSPITAL_COMMUNITY)
Admission: EM | Admit: 2011-04-16 | Discharge: 2011-04-16 | Disposition: A | Discharge status: Home / Self Care | Payer: Self-pay | Attending: Emergency Medicine | Admitting: Emergency Medicine

## 2011-04-16 DIAGNOSIS — M542 Cervicalgia: Secondary | ICD-10-CM | POA: Insufficient documentation

## 2011-04-16 DIAGNOSIS — S139XXA Sprain of joints and ligaments of unspecified parts of neck, initial encounter: Secondary | ICD-10-CM | POA: Insufficient documentation

## 2011-04-23 ENCOUNTER — Emergency Department (HOSPITAL_COMMUNITY)
Admission: EM | Admit: 2011-04-23 | Discharge: 2011-04-23 | Disposition: A | Discharge status: Home / Self Care | Payer: Self-pay | Attending: Emergency Medicine | Admitting: Emergency Medicine

## 2011-04-23 DIAGNOSIS — S335XXA Sprain of ligaments of lumbar spine, initial encounter: Secondary | ICD-10-CM | POA: Insufficient documentation

## 2011-04-23 DIAGNOSIS — M545 Low back pain, unspecified: Secondary | ICD-10-CM | POA: Insufficient documentation

## 2011-04-23 DIAGNOSIS — X500XXA Overexertion from strenuous movement or load, initial encounter: Secondary | ICD-10-CM | POA: Insufficient documentation

## 2011-05-04 LAB — POCT I-STAT, CHEM 8
BUN: 15
Creatinine, Ser: 1.1
Glucose, Bld: 94
Sodium: 139
TCO2: 22

## 2011-05-04 LAB — DIFFERENTIAL
Eosinophils Absolute: 0.2
Eosinophils Relative: 2
Lymphocytes Relative: 22
Lymphs Abs: 2
Monocytes Relative: 8

## 2011-05-04 LAB — CBC
HCT: 39.4
Hemoglobin: 13.9
MCV: 93.6
Platelets: 182
RBC: 4.21
WBC: 9.1

## 2011-05-06 LAB — POCT I-STAT, CHEM 8
Calcium, Ion: 1.08 — ABNORMAL LOW
Chloride: 105
HCT: 41
Sodium: 135
TCO2: 23

## 2011-05-06 LAB — POCT CARDIAC MARKERS
CKMB, poc: <1 — ABNORMAL LOW
Operator id: 294501
Troponin i, poc: <0.05

## 2011-05-06 LAB — D-DIMER, QUANTITATIVE: D-Dimer, Quant: 0.22

## 2011-05-07 ENCOUNTER — Emergency Department (HOSPITAL_BASED_OUTPATIENT_CLINIC_OR_DEPARTMENT_OTHER)
Admission: EM | Admit: 2011-05-07 | Discharge: 2011-05-08 | Disposition: A | Discharge status: Home / Self Care | Payer: Self-pay | Attending: Emergency Medicine | Admitting: Emergency Medicine

## 2011-05-07 ENCOUNTER — Encounter (HOSPITAL_BASED_OUTPATIENT_CLINIC_OR_DEPARTMENT_OTHER): Payer: Self-pay | Admitting: *Deleted

## 2011-05-07 DIAGNOSIS — R112 Nausea with vomiting, unspecified: Secondary | ICD-10-CM | POA: Insufficient documentation

## 2011-05-07 DIAGNOSIS — R1013 Epigastric pain: Secondary | ICD-10-CM | POA: Insufficient documentation

## 2011-05-07 MED ORDER — ONDANSETRON HCL 4 MG/2ML IJ SOLN
INTRAMUSCULAR | Status: AC
  Filled 2011-05-07: qty 2

## 2011-05-07 NOTE — ED Notes (Signed)
EMS gave pt Zofran 4mg  IV prior to arrival.

## 2011-05-07 NOTE — ED Notes (Signed)
Nausea and vomiting 

## 2011-05-08 LAB — DIFFERENTIAL
Basophils Absolute: 0 10*3/uL (ref 0.0–0.1)
Basophils Relative: 0 % (ref 0–1)
Eosinophils Absolute: 0.1 10*3/uL (ref 0.0–0.7)
Eosinophils Relative: 2 % (ref 0–5)
Lymphocytes Relative: 38 % (ref 12–46)
Lymphs Abs: 2.8 10*3/uL (ref 0.7–4.0)
Monocytes Absolute: 0.5 10*3/uL (ref 0.1–1.0)
Monocytes Relative: 7 % (ref 3–12)
Neutro Abs: 3.9 10*3/uL (ref 1.7–7.7)
Neutrophils Relative %: 53 % (ref 43–77)

## 2011-05-08 LAB — COMPREHENSIVE METABOLIC PANEL
ALT: 15 U/L (ref 0–35)
AST: 23 U/L (ref 0–37)
Albumin: 3.7 g/dL (ref 3.5–5.2)
Alkaline Phosphatase: 45 U/L (ref 39–117)
BUN: 12 mg/dL (ref 6–23)
CO2: 28 mEq/L (ref 19–32)
Calcium: 8.9 mg/dL (ref 8.4–10.5)
Chloride: 104 mEq/L (ref 96–112)
Creatinine, Ser: 0.6 mg/dL (ref 0.50–1.10)
GFR calc Af Amer: >60 mL/min (ref >60)
GFR calc non Af Amer: >60 mL/min (ref >60)
Glucose, Bld: 99 mg/dL (ref 70–99)
Potassium: 3.3 mEq/L — ABNORMAL LOW (ref 3.5–5.1)
Sodium: 140 mEq/L (ref 135–145)
Total Bilirubin: 0.3 mg/dL (ref 0.3–1.2)
Total Protein: 6.3 g/dL (ref 6.0–8.3)

## 2011-05-08 LAB — URINE MICROSCOPIC-ADD ON

## 2011-05-08 LAB — URINALYSIS, ROUTINE W REFLEX MICROSCOPIC
Bilirubin Urine: NEGATIVE
Glucose, UA: NEGATIVE mg/dL
Hgb urine dipstick: NEGATIVE
Ketones, ur: 15 mg/dL — AB
Leukocytes, UA: NEGATIVE
Nitrite: NEGATIVE
Protein, ur: 30 mg/dL — AB
Specific Gravity, Urine: 1.023 (ref 1.005–1.030)
Urobilinogen, UA: 1 mg/dL (ref 0.0–1.0)
pH: 8.5 — ABNORMAL HIGH (ref 5.0–8.0)

## 2011-05-08 LAB — CBC
Platelets: 133 10*3/uL — ABNORMAL LOW (ref 150–400)
RBC: 4.05 MIL/uL (ref 3.87–5.11)
RDW: 11.7 % (ref 11.5–15.5)
WBC: 7.4 10*3/uL (ref 4.0–10.5)

## 2011-05-08 LAB — LIPASE, BLOOD: Lipase: 37 U/L (ref 11–59)

## 2011-05-08 LAB — PREGNANCY, URINE: Preg Test, Ur: NEGATIVE

## 2011-05-08 MED ORDER — ONDANSETRON HCL 4 MG/2ML IJ SOLN
4.0000 mg | Freq: Once | INTRAMUSCULAR | Status: AC
  Administered 2011-05-08: 4 mg via INTRAVENOUS
  Filled 2011-05-08: qty 2

## 2011-05-08 MED ORDER — PROMETHAZINE HCL 25 MG PO TABS: 25.0000 mg | ORAL_TABLET | Freq: Four times a day (QID) | ORAL | Status: AC | PRN

## 2011-05-08 MED ORDER — KETOROLAC TROMETHAMINE 30 MG/ML IJ SOLN
30.0000 mg | Freq: Once | INTRAMUSCULAR | Status: AC
  Administered 2011-05-08: 30 mg via INTRAVENOUS
  Filled 2011-05-08: qty 1

## 2011-05-08 NOTE — ED Notes (Signed)
Placed stretcher in room due to children being sleepy.  Children in second stretcher with side rales up bilat. Pt. Is watching her children in the stretcher beside of her.  Pt  Has had no vomiting or diarrhea since she has been in room #8.

## 2011-05-08 NOTE — ED Provider Notes (Signed)
History     CSN: [id number] Arrival date & time: 05/07/2011 10:58 PM  Chief Complaint  Patient presents with  . Nausea    (Consider location/radiation/quality/duration/timing/severity/associated sxs/prior treatment) HPI Comments: Patient ate pizza, then began with epigastric cramping and vomiting that was severe and recurrent.  She feel somewhat better after getting meds in ambulance.  No fevers.  No chills.  No ill contacts.  Others who ate same food did not get sick.  Aggravated by eating.    The history is provided by the patient.    History reviewed. No pertinent past medical history.  Past Surgical History  Procedure Date  . Tonsillectomy     History reviewed. No pertinent family history.  History  Substance Use Topics  . Smoking status: Current Some Day Smoker  . Smokeless tobacco: Not on file  . Alcohol Use: 0.0 oz/week    OB History    Grav Para Term Preterm Abortions TAB SAB Ect Mult Living                  Review of Systems  Constitutional: Negative for fever, chills and fatigue.  HENT: Negative for neck stiffness.   Gastrointestinal: Positive for nausea, vomiting and abdominal pain. Negative for diarrhea and constipation.  All other systems reviewed and are negative.    Allergies  Bee  Home Medications   Current Outpatient Rx  Name Route Sig Dispense Refill  . IBUPROFEN 200 MG PO TABS Oral Take 400 mg by mouth as needed. Pain      . ONDANSETRON HCL 4 MG/2ML IJ SOLN Intravenous Inject 4 mg into the vein once.      Marland Kitchen DIPHENHYDRAMINE HCL 25 MG PO CAPS Oral Take 25 mg by mouth as needed. rash       BP 109/72  Pulse 71  Temp(Src) 98.3 F (36.8 C) (Oral)  SpO2 100%  LMP 03/18/2011  Physical Exam  Constitutional: She is oriented to person, place, and time. She appears well-developed and well-nourished. No distress.  HENT:  Head: Normocephalic and atraumatic.  Neck: Normal range of motion. Neck supple.  Cardiovascular: Normal rate and regular  rhythm.  Exam reveals no gallop and no friction rub.   No murmur heard. Pulmonary/Chest: Effort normal and breath sounds normal. No respiratory distress.  Abdominal: Soft. Bowel sounds are normal. She exhibits no distension. There is no tenderness.  Musculoskeletal: Normal range of motion.  Neurological: She is alert and oriented to person, place, and time.  Skin: Skin is warm and dry. She is not diaphoretic.    ED Course  Procedures (including critical care time)   Labs Reviewed  CBC  DIFFERENTIAL  COMPREHENSIVE METABOLIC PANEL  LIPASE, BLOOD  URINALYSIS, ROUTINE W REFLEX MICROSCOPIC  PREGNANCY, URINE   No results found.   No diagnosis found.    MDM  Labs look okay.  Possibly a gallbladder attack?  Will set up an ultrasound as an outpatient and treat with phenergan for now.        Geoffery Lyons, MD 05/08/11 [phone]

## 2011-05-08 NOTE — ED Notes (Signed)
MD at bedside. 

## 2011-05-09 ENCOUNTER — Other Ambulatory Visit (HOSPITAL_BASED_OUTPATIENT_CLINIC_OR_DEPARTMENT_OTHER): Payer: Self-pay

## 2011-05-10 ENCOUNTER — Ambulatory Visit (HOSPITAL_BASED_OUTPATIENT_CLINIC_OR_DEPARTMENT_OTHER)
Admission: RE | Admit: 2011-05-10 | Discharge: 2011-05-10 | Disposition: A | Discharge status: Home / Self Care | Payer: Self-pay | Source: Ambulatory Visit | Attending: Emergency Medicine | Admitting: Emergency Medicine

## 2011-05-10 DIAGNOSIS — R6883 Chills (without fever): Secondary | ICD-10-CM | POA: Insufficient documentation

## 2011-05-10 DIAGNOSIS — R112 Nausea with vomiting, unspecified: Secondary | ICD-10-CM | POA: Insufficient documentation

## 2011-05-10 LAB — URINALYSIS, ROUTINE W REFLEX MICROSCOPIC
Glucose, UA: NEGATIVE
Ketones, ur: 15 — AB
Ketones, ur: NEGATIVE
Nitrite: NEGATIVE
Specific Gravity, Urine: 1.025
Specific Gravity, Urine: 1.015
pH: 6
pH: 6

## 2011-05-10 LAB — WET PREP, GENITAL: Yeast Wet Prep HPF POC: NONE SEEN

## 2011-06-08 ENCOUNTER — Encounter (HOSPITAL_BASED_OUTPATIENT_CLINIC_OR_DEPARTMENT_OTHER): Payer: Self-pay | Admitting: *Deleted

## 2011-06-08 ENCOUNTER — Emergency Department (HOSPITAL_BASED_OUTPATIENT_CLINIC_OR_DEPARTMENT_OTHER)
Admission: EM | Admit: 2011-06-08 | Discharge: 2011-06-08 | Disposition: A | Discharge status: Home / Self Care | Payer: Self-pay | Attending: Emergency Medicine | Admitting: Emergency Medicine

## 2011-06-08 DIAGNOSIS — F17200 Nicotine dependence, unspecified, uncomplicated: Secondary | ICD-10-CM | POA: Insufficient documentation

## 2011-06-08 DIAGNOSIS — S61409A Unspecified open wound of unspecified hand, initial encounter: Secondary | ICD-10-CM | POA: Insufficient documentation

## 2011-06-08 DIAGNOSIS — W2203XA Walked into furniture, initial encounter: Secondary | ICD-10-CM | POA: Insufficient documentation

## 2011-06-08 DIAGNOSIS — S61419A Laceration without foreign body of unspecified hand, initial encounter: Secondary | ICD-10-CM

## 2011-06-08 HISTORY — DX: Anxiety disorder, unspecified: F41.9

## 2011-06-08 MED ORDER — LIDOCAINE HCL 2 % IJ SOLN
5.0000 mL | Freq: Once | INTRAMUSCULAR | Status: AC
  Administered 2011-06-08: 100 mg via INTRADERMAL
  Filled 2011-06-08: qty 1

## 2011-06-08 MED ORDER — TETANUS-DIPHTH-ACELL PERTUSSIS 5-2.5-18.5 LF-MCG/0.5 IM SUSP
0.5000 mL | Freq: Once | INTRAMUSCULAR | Status: AC
  Administered 2011-06-08: 0.5 mL via INTRAMUSCULAR
  Filled 2011-06-08: qty 0.5

## 2011-06-08 NOTE — ED Provider Notes (Signed)
History     CSN: [id number] Arrival date & time: 06/08/2011  5:56 PM   First MD Initiated Contact with Patient 06/08/11 1806      Chief Complaint  Patient presents with  . Laceration    right hand    (Consider location/radiation/quality/duration/timing/severity/associated sxs/prior treatment) HPI Comments: Injury occurred while the patient was moving furniture. Range of motion of the hand. Right-hand-dominant  Patient is a 30 y.o. female presenting with skin laceration. The history is provided by the patient. No language interpreter was used.  Laceration  The incident occurred 1 to 2 hours ago. The laceration is located on the right hand. The laceration is 1 cm in size. The laceration mechanism is unknown.The pain is moderate. The pain has been constant since onset. She reports no foreign bodies present. Her tetanus status is out of date.    Past Medical History  Diagnosis Date  . Anxiety     Past Surgical History  Procedure Date  . Tonsillectomy     No family history on file.  History  Substance Use Topics  . Smoking status: Current Some Day Smoker  . Smokeless tobacco: Not on file  . Alcohol Use: 0.0 oz/week    OB History    Grav Para Term Preterm Abortions TAB SAB Ect Mult Living                  Review of Systems  Constitutional: Negative for fever, activity change, appetite change and fatigue.  HENT: Negative for congestion, sore throat, rhinorrhea, neck pain and neck stiffness.   Respiratory: Negative for cough and shortness of breath.   Cardiovascular: Negative for chest pain and palpitations.  Gastrointestinal: Negative for nausea, vomiting and abdominal pain.  Genitourinary: Negative for dysuria, urgency, frequency and flank pain.  Neurological: Negative for dizziness, light-headedness and headaches.  All other systems reviewed and are negative.    Allergies  Bee  Home Medications   Current Outpatient Rx  Name Route Sig Dispense Refill  .  DIPHENHYDRAMINE HCL 25 MG PO CAPS Oral Take 25 mg by mouth as needed. rash     . IBUPROFEN 200 MG PO TABS Oral Take 400 mg by mouth as needed. Pain      . ONDANSETRON HCL 4 MG/2ML IJ SOLN Intravenous Inject 4 mg into the vein once.        BP 119/79  Pulse 75  Temp(Src) 98.5 F (36.9 C) (Oral)  Resp 20  SpO2 100%  LMP 05/18/2011  Physical Exam  Nursing note and vitals reviewed. Constitutional: She is oriented to person, place, and time. She appears well-developed and well-nourished. No distress.  HENT:  Head: Normocephalic and atraumatic.  Mouth/Throat: Oropharynx is clear and moist. No oropharyngeal exudate.  Eyes: Conjunctivae and EOM are normal. Pupils are equal, round, and reactive to light.  Neck: Normal range of motion. Neck supple.  Cardiovascular: Normal rate, regular rhythm, normal heart sounds and intact distal pulses.  Exam reveals no gallop and no friction rub.   No murmur heard. Pulmonary/Chest: Effort normal and breath sounds normal. No respiratory distress.  Abdominal: Soft. Bowel sounds are normal. There is no tenderness.  Musculoskeletal: Normal range of motion. She exhibits no tenderness.  Neurological: She is alert and oriented to person, place, and time.  Skin: Skin is warm and dry. No rash noted.       1 cm laceration to the dorsal hand just proximal to the index finger. There is no tendon involvement as the patient has  full range of motion. There is no tendon laceration on wound exploration. The laceration is actually to the middle finger side of the knuckle    ED Course  LACERATION REPAIR Date/Time: 06/08/2011 6:37 PM Performed by: Dayton Bailiff Authorized by: Dayton Bailiff Consent: Verbal consent obtained. Written consent not obtained. Risks and benefits: risks, benefits and alternatives were discussed Consent given by: patient Patient identity confirmed: verbally with patient Time out: Immediately prior to procedure a "time out" was called to verify the  correct patient, procedure, equipment, support staff and site/side marked as required. Body area: upper extremity Location details: right hand Laceration length: 1 cm Tendon involvement: none Nerve involvement: none Vascular damage: no Anesthesia: local infiltration Local anesthetic: lidocaine 2% without epinephrine Anesthetic total: 1 ml Patient sedated: no Preparation: Patient was prepped and draped in the usual sterile fashion. Irrigation solution: saline Irrigation method: syringe Amount of cleaning: standard Debridement: none Degree of undermining: none Skin closure: 5-0 nylon Number of sutures: 2 Technique: simple Approximation: close Approximation difficulty: simple Dressing: antibiotic ointment and gauze roll Patient tolerance: Patient tolerated the procedure well with no immediate complications.   (including critical care time)  Labs Reviewed - No data to display No results found.   1. Hand laceration       MDM  Simple laceration without hand tendon involvement. Tetanus was updated. Wound was cleaned and closed. She'll be discharged home with instructions to have the sutures removed in 7-10 days        Dayton Bailiff, MD 06/08/11 1842

## 2011-06-08 NOTE — ED Notes (Signed)
Wound cleaned and dressed by EMT.

## 2011-06-08 NOTE — ED Notes (Signed)
Lifting furniture and noticed her hand bleeding.  Hand is wrapped in a towel

## 2011-06-19 ENCOUNTER — Emergency Department (HOSPITAL_BASED_OUTPATIENT_CLINIC_OR_DEPARTMENT_OTHER)
Admission: EM | Admit: 2011-06-19 | Discharge: 2011-06-19 | Disposition: A | Discharge status: Home / Self Care | Payer: Self-pay | Attending: Emergency Medicine | Admitting: Emergency Medicine

## 2011-06-19 ENCOUNTER — Encounter (HOSPITAL_BASED_OUTPATIENT_CLINIC_OR_DEPARTMENT_OTHER): Payer: Self-pay | Admitting: *Deleted

## 2011-06-19 DIAGNOSIS — F17200 Nicotine dependence, unspecified, uncomplicated: Secondary | ICD-10-CM | POA: Insufficient documentation

## 2011-06-19 DIAGNOSIS — Z4802 Encounter for removal of sutures: Secondary | ICD-10-CM | POA: Insufficient documentation

## 2011-06-19 DIAGNOSIS — F411 Generalized anxiety disorder: Secondary | ICD-10-CM | POA: Insufficient documentation

## 2011-06-19 NOTE — ED Notes (Signed)
Pt states she had sutures placed on the 30th. Still having pain in joint.

## 2011-06-19 NOTE — ED Notes (Signed)
Sutures removed intact wound healed well approximated wound care reviewed

## 2011-06-19 NOTE — ED Notes (Signed)
Site intact with drsg pt verbalizes care plan well

## 2011-06-19 NOTE — ED Provider Notes (Signed)
Scribed for Gwyneth Sprout, MD, the patient was seen in room MH08/MH08 . This chart was scribed by Ellie Lunch.   CSN: [id number] Arrival date & time: 06/19/2011  2:53 PM   First MD Initiated Contact with Patient 06/19/11 1503      Chief Complaint  Patient presents with  . Suture / Staple Removal    (Consider location/radiation/quality/duration/timing/severity/associated sxs/prior treatment) Patient is a 30 y.o. female presenting with suture removal. The history is provided by the patient. No language interpreter was used.  Suture / Staple Removal  The sutures were placed 11 to 14 days ago. There is no redness present. There is no swelling present. The pain has not changed.    Kirsten [name] is a 30 y.o. female who presents to the Emergency Department complaining of  Right hand pain. Pt seen in ED 06/08/2011 for right hand laceration. Pt had lac sutured 11 days ago. Currently c/o pain in right hand.   Past Medical History  Diagnosis Date  . Anxiety     Past Surgical History  Procedure Date  . Tonsillectomy     History reviewed. No pertinent family history.  History  Substance Use Topics  . Smoking status: Current Some Day Smoker  . Smokeless tobacco: Not on file  . Alcohol Use: 0.0 oz/week    Review of Systems  Musculoskeletal: Negative for joint swelling.  Skin: Positive for wound. Negative for color change.    Allergies  Bee  Home Medications   Current Outpatient Rx  Name Route Sig Dispense Refill  . DIPHENHYDRAMINE HCL 25 MG PO CAPS Oral Take 25 mg by mouth as needed. rash     . IBUPROFEN 200 MG PO TABS Oral Take 400 mg by mouth as needed. Pain      . ONDANSETRON HCL 4 MG/2ML IJ SOLN Intravenous Inject 4 mg into the vein once.        BP 114/63  Pulse 78  Temp(Src) 98.4 F (36.9 C) (Oral)  Resp 18  Ht 5\' 4"  (1.626 m)  Wt 127 lb (57.607 kg)  BMI 21.80 kg/m2  SpO2 99%  LMP 06/10/2011  Physical Exam  Nursing note and vitals  reviewed. Constitutional: She is oriented to person, place, and time. She appears well-developed and well-nourished. No distress.  Eyes: Conjunctivae are normal. No scleral icterus.  Neurological: She is alert and oriented to person, place, and time.  Skin: Skin is warm and dry.       Healing 2cm laceration to right MCP. Some pain in thenar eminence but good strength of thumb and index finger.      ED Course  Procedures (including critical care time) OTHER DATA REVIEWED: Nursing notes, vital signs, and past medical records reviewed.   DIAGNOSTIC STUDIES: Oxygen Saturation is 99% on room air, normal by my interpretation.     1. Encounter for removal of sutures      MDM  Sutures removed and good strength and movement.  I personally performed the services described in this documentation, which was scribed in my presence.  The recorded information has been reviewed and considered.        Gwyneth Sprout, MD 06/19/11 2218

## 2011-06-25 ENCOUNTER — Ambulatory Visit (INDEPENDENT_AMBULATORY_CARE_PROVIDER_SITE_OTHER): Payer: Self-pay | Admitting: Family Medicine

## 2011-06-25 ENCOUNTER — Encounter: Payer: Self-pay | Admitting: Family Medicine

## 2011-06-25 ENCOUNTER — Ambulatory Visit (HOSPITAL_BASED_OUTPATIENT_CLINIC_OR_DEPARTMENT_OTHER)
Admission: RE | Admit: 2011-06-25 | Discharge: 2011-06-25 | Disposition: A | Discharge status: Home / Self Care | Payer: Self-pay | Source: Ambulatory Visit | Attending: Family Medicine | Admitting: Family Medicine

## 2011-06-25 VITALS — BP 115/78 | HR 94 | Temp 98.0°F | Ht 64.0 in | Wt 128.0 lb

## 2011-06-25 DIAGNOSIS — S6990XA Unspecified injury of unspecified wrist, hand and finger(s), initial encounter: Secondary | ICD-10-CM

## 2011-06-25 DIAGNOSIS — X58XXXA Exposure to other specified factors, initial encounter: Secondary | ICD-10-CM

## 2011-06-25 DIAGNOSIS — M79643 Pain in unspecified hand: Secondary | ICD-10-CM

## 2011-06-25 DIAGNOSIS — M79609 Pain in unspecified limb: Secondary | ICD-10-CM

## 2011-06-25 DIAGNOSIS — S6980XA Other specified injuries of unspecified wrist, hand and finger(s), initial encounter: Secondary | ICD-10-CM

## 2011-06-25 DIAGNOSIS — M25549 Pain in joints of unspecified hand: Secondary | ICD-10-CM | POA: Insufficient documentation

## 2011-06-25 MED ORDER — HYDROCODONE-ACETAMINOPHEN 5-325 MG PO TABS: 1.0000 | ORAL_TABLET | Freq: Four times a day (QID) | ORAL | Status: AC | PRN

## 2011-06-25 MED ORDER — CEPHALEXIN 500 MG PO CAPS: 500.0000 mg | ORAL_CAPSULE | Freq: Four times a day (QID) | ORAL | Status: AC

## 2011-06-25 NOTE — Progress Notes (Signed)
Subjective:    Patient ID: Kirsten [name], female    DOB: May 30, 1981, 30 y.o.   MRN: [id number]  [facility]: [facility]  HPI 30 yo F here for right index finger laceration.  Patient reports she was moving items in a storage unit on 11/30 when hit the dorsal aspect of 2nd MCP joint on side of something (unsure what it was) causing a laceration about 1 cm long over this area. She went to ED and had 2 sutures placed - noted she was able to extend and flex 2nd digit and not felt to have a tendinous injury as a result.   Returned about 10 days later and had sutures removed, splint placed. Pain has worsened instead of improved. No redness, purulence, fever. Has not had x-rays. Pain worse with extension. Is right handed. Lack of insurance a major barrier to her care. No prior injuries to this hand.  Past Medical History  Diagnosis Date  . Anxiety     Current Outpatient Prescriptions on File Prior to Visit  Medication Sig Dispense Refill  . diphenhydrAMINE (BENADRYL) 25 mg capsule Take 25 mg by mouth as needed. rash       . ibuprofen (ADVIL,MOTRIN) 200 MG tablet Take 400 mg by mouth every 6 (six) hours as needed. For pain       . ondansetron (ZOFRAN) 4 MG/2ML SOLN Inject 4 mg into the vein once.          Past Surgical History  Procedure Date  . Tonsillectomy     Allergies  Allergen Reactions  . Bee Swelling    At sting site    History   Social History  . Marital Status: Divorced    Spouse Name: N/A    Number of Children: N/A  . Years of Education: N/A   Occupational History  . Not on file.   Social History Main Topics  . Smoking status: Current Some Day Smoker  . Smokeless tobacco: Not on file  . Alcohol Use: 0.0 oz/week  . Drug Use: No  . Sexually Active: No   Other Topics Concern  . Not on file   Social History Narrative  . No narrative on file    Family History  Problem Relation Age of Onset  . Diabetes Neg Hx   . Heart attack Neg Hx   .  Hyperlipidemia Neg Hx   . Hypertension Neg Hx   . Sudden death Neg Hx     BP 115/78  Pulse 94  Temp(Src) 98 F (36.7 C) (Oral)  Ht 5\' 4"  (1.626 m)  Wt 128 lb (58.06 kg)  BMI 21.97 kg/m2  LMP 06/10/2011  Review of Systems See HPI above.    Objective:   Physical Exam Gen: NAD  R hand: Well healing laceration dorsally over 2nd MCP - more to ulnar side.  No erythema, palpable mass.  Mild swelling compared to left hand.  No bruising. TTP at MTP over extensor digitorum.  No volar, collateral TTP. Able to flex and extend against resistance at MCP, PIP, and DIP 2nd digit.  Pain on resisting extension at all three joints however (pain back at 2nd MCP). Collateral ligaments intact at all joints of 2nd digit. NVI distally with < 2 sec cap refill. Sensation intact.  MSK u/s: On long views appears to be a partial tear of extensor digitorum as crosses 2nd MCP - however, no retraction of distal or proximal portion and fibers evident to suggest this is not a complete  tear.  Noted on transverse views as well.  No increased neovascularity.  No bony irregularity.    Assessment & Plan:  1. Right 2nd digit pain - x-rays show no evidence of foreign body or fracture.  Ultrasound (while I do not have the hockey stick probe for digits) is consistent with a partial tear of extensor digitorum - laceration site would fit with this as well.  Lack of insurance is a major barrier to her care - she will contact [employer] and apply for medicaid.  Continue ibuprofen, rx vicodin for severe pain, and keflex qid x 10 days for antibiotic prophylaxis from her finger laceration.  Has already had tetanus booster (given in ED).  Given this is a partial tear, will try conservative care.  However, if she does not improve as expected we discussed next step would be to move forward with MRI.  F/u in 3 weeks.

## 2011-06-25 NOTE — Patient Instructions (Signed)
Your x-rays were negative for a foreign body or fracture. The ultrasound, however, is consistent with a probable partial tear of your extensor tendon to your index finger. Take ibuprofen 600mg  three times a day as needed for pain and inflammation (with food). Take vicodin as needed for severe pain (no driving on this medicine) - we do not refill this though we could step you down to a more mild narcotic medicine. Finish full course of keflex until this is gone - finger lacerations are more associated with infections to tendons and joints (though you do not have one currently). Ice as needed. Buddy tape index and middle fingers together when you go to work. Contact [employer] and Medicaid - when either one of these goes through, call me - I would recommend you start physical therapy to work on rehabilitating your injury, work out the stiffness in the joint. Follow up with me in about 3 weeks.

## 2011-06-25 NOTE — Assessment & Plan Note (Signed)
Right 2nd digit pain - x-rays show no evidence of foreign body or fracture.  Ultrasound (while I do not have the hockey stick probe for digits) is consistent with a partial tear of extensor digitorum - laceration site would fit with this as well.  Lack of insurance is a major barrier to her care - she will contact [employer] and apply for medicaid.  Continue ibuprofen, rx vicodin for severe pain, and keflex qid x 10 days for antibiotic prophylaxis from her finger laceration.  Has already had tetanus booster (given in ED).  Given this is a partial tear, will try conservative care.  However, if she does not improve as expected we discussed next step would be to move forward with MRI.  F/u in 3 weeks.

## 2011-07-07 ENCOUNTER — Ambulatory Visit (INDEPENDENT_AMBULATORY_CARE_PROVIDER_SITE_OTHER): Payer: Self-pay | Admitting: Family Medicine

## 2011-07-07 ENCOUNTER — Encounter: Payer: Self-pay | Admitting: Family Medicine

## 2011-07-07 VITALS — BP 109/69 | HR 72 | Temp 97.9°F | Ht 64.0 in | Wt 128.0 lb

## 2011-07-07 DIAGNOSIS — S6990XA Unspecified injury of unspecified wrist, hand and finger(s), initial encounter: Secondary | ICD-10-CM

## 2011-07-08 ENCOUNTER — Encounter: Payer: Self-pay | Admitting: Family Medicine

## 2011-07-08 NOTE — Progress Notes (Addendum)
Subjective:    Patient ID: Kirsten [name], female    DOB: 05/30/1981, 30 y.o.   MRN: [id number]  [facility]: [facility]  HPI  30 yo F here for f/u right index finger laceration.  11/16: Patient reports she was moving items in a storage unit on 10/30 when hit the dorsal aspect of 2nd MCP joint on side of something (unsure what it was) causing a laceration about 1 cm long over this area. She went to ED and had 2 sutures placed - noted she was able to extend and flex 2nd digit and not felt to have a tendinous injury as a result.   Returned about 10 days later and had sutures removed, splint placed. Pain has worsened instead of improved. No redness, purulence, fever. Has not had x-rays. Pain worse with extension. Is right handed. Lack of insurance a major barrier to her care. No prior injuries to this hand.  11/28: Patient returned today noting pain has worsened since last visit. Is taking ibuprofen, occasionally taking vicodin but it makes her very sleepy. Buddy taped this for 1 week but hasn't since. Put her hand in her right pocket and had severe pain in area of laceration. She has not applied for medicaid or through [employer] for any kind of coverage which is the major barrier to further evaluation/care.  Past Medical History  Diagnosis Date  . Anxiety     Current Outpatient Prescriptions on File Prior to Visit  Medication Sig Dispense Refill  . ibuprofen (ADVIL,MOTRIN) 200 MG tablet Take 400 mg by mouth every 6 (six) hours as needed. For pain         Past Surgical History  Procedure Date  . Tonsillectomy     Allergies  Allergen Reactions  . Bee Swelling    At sting site    History   Social History  . Marital Status: Divorced    Spouse Name: N/A    Number of Children: N/A  . Years of Education: N/A   Occupational History  . Not on file.   Social History Main Topics  . Smoking status: Current Some Day Smoker  . Smokeless tobacco: Not on file  .  Alcohol Use: 0.0 oz/week  . Drug Use: No  . Sexually Active: No   Other Topics Concern  . Not on file   Social History Narrative  . No narrative on file    Family History  Problem Relation Age of Onset  . Diabetes Neg Hx   . Heart attack Neg Hx   . Hyperlipidemia Neg Hx   . Hypertension Neg Hx   . Sudden death Neg Hx     BP 109/69  Pulse 72  Temp(Src) 97.9 F (36.6 C) (Oral)  Ht 5\' 4"  (1.626 m)  Wt 128 lb (58.06 kg)  BMI 21.97 kg/m2  LMP 06/10/2011  Review of Systems  See HPI above.    Objective:   Physical Exam  Gen: NAD  R hand: Well healing laceration dorsally over 2nd MCP - more to ulnar side.  No erythema, palpable mass.  Mild swelling compared to left hand.  No bruising. TTP at 2nd MTP over extensor digitorum.  No volar, collateral TTP. Able to flex and extend against resistance at MCP, PIP, and DIP 2nd digit.  Pain on resisting extension at all three joints however (pain back at 2nd MCP).  Able to completely extend 2nd digit but very stiff with trying to flex at MCP (pain dorsal at 2nd  MCP). Collateral ligaments intact at all joints of 2nd digit. NVI distally with < 2 sec cap refill. Sensation intact.  MSK u/s: Again on long views appears to be a partial tear of extensor digitorum as crosses 2nd MCP - I do see fibers at deep aspect of tendon that appear to be intact though superficial ~80% appears to have partial tear with hypoechoic area/fluid superficial aspect of the tendon.  Noted on transverse views as well.  No increased neovascularity.  No bony irregularity.    Assessment & Plan:  1. Right 2nd digit pain - x-rays show no evidence of foreign body or fracture.  Ultrasound (while I do not have the hockey stick probe for digits) is consistent with a partial tear of extensor digitorum - laceration site would fit with this as well.  Again, lack of insurance is a major barrier to her care - she will contact [employer] and apply for medicaid.  Am concerned  that she is now a month out from the injury, continues to have worsening pain and stiffness and that she will need surgical intervention for this.  She stated she will contact medicaid and [employer] Cobb this week to get started on process - would then do MRI to confirm findings and try to get her in with an orthopedic/hand surgeon.  Addendum 09/02/11: Patient called stating she is still having hand pain.  Stated she hasn't heard back from [facility] and she sent in paperwork to [facility] to apply for [facility] Coverage.  [facility] has no record of having received any paperwork and there's no process by which paperwork can be mailed to her.  Patient instructed again to call [employer] and start this process - if she has tendon injury requiring surgery, the further out she goes, the more likely this is to be irreparable.  Advised I would send a 1 time prescription for tramadol to her pharmacy if she would like but she has to follow through with this.

## 2011-07-08 NOTE — Assessment & Plan Note (Signed)
x-rays show no evidence of foreign body or fracture.  Ultrasound (while I do not have the hockey stick probe for digits) is consistent with a partial tear of extensor digitorum - laceration site would fit with this as well.  Again, lack of insurance is a major barrier to her care - she will contact [employer] and apply for medicaid.  Am concerned that she is now a month out from the injury, continues to have worsening pain and stiffness and that she will need surgical intervention for this.  She stated she will contact medicaid and [employer] Cobb this week to get started on process - would then do MRI to confirm findings and try to get her in with an orthopedic/hand surgeon.

## 2011-07-08 NOTE — Progress Notes (Deleted)
  Subjective:    Patient ID: Kirsten [name], female    DOB: January 09, 1981, 30 y.o.   MRN: [id number]  HPI    Review of Systems     Objective:   Physical Exam        Assessment & Plan:

## 2011-07-16 ENCOUNTER — Ambulatory Visit: Payer: Self-pay | Admitting: Family Medicine

## 2011-09-02 MED ORDER — TRAMADOL HCL 50 MG PO TABS: 50.0000 mg | ORAL_TABLET | Freq: Three times a day (TID) | ORAL | Status: DC | PRN

## 2011-09-02 NOTE — Progress Notes (Signed)
Addended by: Lenda Kelp on: 09/02/2011 09:20 AM   Modules accepted: Orders

## 2011-09-21 ENCOUNTER — Emergency Department (HOSPITAL_COMMUNITY)
Admission: EM | Admit: 2011-09-21 | Discharge: 2011-09-21 | Disposition: A | Discharge status: Home / Self Care | Payer: Self-pay

## 2011-10-18 ENCOUNTER — Encounter (HOSPITAL_BASED_OUTPATIENT_CLINIC_OR_DEPARTMENT_OTHER): Payer: Self-pay

## 2011-10-18 ENCOUNTER — Emergency Department (INDEPENDENT_AMBULATORY_CARE_PROVIDER_SITE_OTHER): Payer: Self-pay

## 2011-10-18 ENCOUNTER — Emergency Department (HOSPITAL_BASED_OUTPATIENT_CLINIC_OR_DEPARTMENT_OTHER)
Admission: EM | Admit: 2011-10-18 | Discharge: 2011-10-18 | Disposition: A | Discharge status: Home / Self Care | Payer: Self-pay | Attending: Emergency Medicine | Admitting: Emergency Medicine

## 2011-10-18 DIAGNOSIS — R102 Pelvic and perineal pain: Secondary | ICD-10-CM

## 2011-10-18 DIAGNOSIS — B9689 Other specified bacterial agents as the cause of diseases classified elsewhere: Secondary | ICD-10-CM | POA: Insufficient documentation

## 2011-10-18 DIAGNOSIS — A499 Bacterial infection, unspecified: Secondary | ICD-10-CM | POA: Insufficient documentation

## 2011-10-18 DIAGNOSIS — N949 Unspecified condition associated with female genital organs and menstrual cycle: Secondary | ICD-10-CM

## 2011-10-18 DIAGNOSIS — F17200 Nicotine dependence, unspecified, uncomplicated: Secondary | ICD-10-CM | POA: Insufficient documentation

## 2011-10-18 DIAGNOSIS — N760 Acute vaginitis: Secondary | ICD-10-CM | POA: Insufficient documentation

## 2011-10-18 DIAGNOSIS — R1032 Left lower quadrant pain: Secondary | ICD-10-CM

## 2011-10-18 DIAGNOSIS — R10819 Abdominal tenderness, unspecified site: Secondary | ICD-10-CM | POA: Insufficient documentation

## 2011-10-18 DIAGNOSIS — R319 Hematuria, unspecified: Secondary | ICD-10-CM | POA: Insufficient documentation

## 2011-10-18 LAB — WET PREP, GENITAL

## 2011-10-18 LAB — URINE MICROSCOPIC-ADD ON

## 2011-10-18 LAB — URINALYSIS, ROUTINE W REFLEX MICROSCOPIC
Leukocytes, UA: NEGATIVE
Nitrite: NEGATIVE
Specific Gravity, Urine: 1.023 (ref 1.005–1.030)
Urobilinogen, UA: 1 mg/dL (ref 0.0–1.0)

## 2011-10-18 LAB — PREGNANCY, URINE: Preg Test, Ur: NEGATIVE

## 2011-10-18 MED ORDER — MORPHINE SULFATE 4 MG/ML IJ SOLN
4.0000 mg | Freq: Once | INTRAMUSCULAR | Status: AC
  Administered 2011-10-18: 4 mg via INTRAVENOUS
  Filled 2011-10-18: qty 1

## 2011-10-18 MED ORDER — HYDROCODONE-ACETAMINOPHEN 5-325 MG PO TABS: 1.0000 | ORAL_TABLET | Freq: Four times a day (QID) | ORAL | Status: AC | PRN

## 2011-10-18 MED ORDER — METRONIDAZOLE 500 MG PO TABS: 500.0000 mg | ORAL_TABLET | Freq: Two times a day (BID) | ORAL | Status: AC

## 2011-10-18 MED ORDER — METRONIDAZOLE 500 MG PO TABS
500.0000 mg | ORAL_TABLET | Freq: Once | ORAL | Status: AC
  Administered 2011-10-18: 500 mg via ORAL
  Filled 2011-10-18: qty 1

## 2011-10-18 NOTE — ED Provider Notes (Signed)
BP 106/71  Pulse 86  Temp(Src) 98.4 F (36.9 C) (Oral)  Resp 16  Ht 5\' 4"  (1.626 m)  Wt 125 lb (56.7 kg)  BMI 21.46 kg/m2  SpO2 98%  LMP 10/15/2011 Pt seen with midlevel provider abd soft, and she is in no distress [place] limited due to pain, however on my exam she is in no distress  I feel she is safe for d/c and close f/u   Joya Gaskins, MD 10/18/11 1914

## 2011-10-18 NOTE — ED Provider Notes (Signed)
History     CSN: [id number]  Arrival date & time 10/18/11  1410   First MD Initiated Contact with Patient 10/18/11 1532      Chief Complaint  Patient presents with  . Abdominal Pain    (Consider location/radiation/quality/duration/timing/severity/associated sxs/prior treatment) Patient is a 31 y.o. female presenting with abdominal pain. The history is provided by the patient. No language interpreter was used.  Abdominal Pain The primary symptoms of the illness include abdominal pain. The current episode started yesterday. The onset of the illness was sudden. The problem has been gradually worsening.  The patient states that she believes she is currently not pregnant. The patient has not had a change in bowel habit. Additional symptoms associated with the illness include hematuria. Symptoms associated with the illness do not include chills, constipation, urgency, frequency or back pain.  Patient reports stabbing LLQ pain onset yesterday.    Past Medical History  Diagnosis Date  . Anxiety     Past Surgical History  Procedure Date  . Tonsillectomy     Family History  Problem Relation Age of Onset  . Diabetes Neg Hx   . Heart attack Neg Hx   . Hyperlipidemia Neg Hx   . Hypertension Neg Hx   . Sudden death Neg Hx     History  Substance Use Topics  . Smoking status: Current Some Day Smoker  . Smokeless tobacco: Not on file  . Alcohol Use: 0.0 oz/week    OB History    Grav Para Term Preterm Abortions TAB SAB Ect Mult Living                  Review of Systems  Constitutional: Negative for chills.  Gastrointestinal: Positive for abdominal pain. Negative for constipation.  Genitourinary: Positive for hematuria. Negative for urgency and frequency.  Musculoskeletal: Negative for back pain.  All other systems reviewed and are negative.    Allergies  Bee  Home Medications   Current Outpatient Rx  Name Route Sig Dispense Refill  . IBUPROFEN 200 MG PO TABS Oral  Take 400 mg by mouth every 6 (six) hours as needed. For pain     . TRAMADOL HCL 50 MG PO TABS Oral Take 1 tablet (50 mg total) by mouth every 8 (eight) hours as needed for pain. 90 tablet 0    BP 106/71  Pulse 86  Temp(Src) 98.4 F (36.9 C) (Oral)  Resp 16  Ht 5\' 4"  (1.626 m)  Wt 125 lb (56.7 kg)  BMI 21.46 kg/m2  SpO2 98%  LMP 10/15/2011  Physical Exam  Nursing note and vitals reviewed. Constitutional: She is oriented to person, place, and time. She appears well-developed and well-nourished.  HENT:  Head: Normocephalic.  Eyes: Conjunctivae are normal. Pupils are equal, round, and reactive to light.  Neck: Normal range of motion. Neck supple.  Cardiovascular: Normal rate, regular rhythm and normal heart sounds.   Pulmonary/Chest: Effort normal and breath sounds normal.  Abdominal: Soft. Bowel sounds are normal. She exhibits no distension and no mass. There is tenderness. There is no guarding.  Musculoskeletal: Normal range of motion.  Neurological: She is alert and oriented to person, place, and time.  Skin: Skin is warm and dry.  Psychiatric: She has a normal mood and affect. Her behavior is normal. Judgment and thought content normal.    ED Course  Procedures (including critical care time) Pelvic exam: normal external genitalia, VULVA: normal appearing vulva with no masses, tenderness or lesions}, VAGINA: :normal appearing  vagina with normal color and minimal bloody discharge, no lesions, CERVIX: normal appearing cervix with small amount of blood at os}, UTERUS: uterus is normal size, shape, consistency and non-tender: ADNEXA: normal adnexa in size, tender on left and no masses, WET MOUNT done - results: rare WBC, moderate clue cells, exam chaperoned by T. Jaster Labs Reviewed  URINALYSIS, ROUTINE W REFLEX MICROSCOPIC - Abnormal; Notable for the following:    Hgb urine dipstick MODERATE (*)    All other components within normal limits  PREGNANCY, URINE  URINE MICROSCOPIC-ADD  ON   No results found.   No diagnosis found. Patient discussed with and seen by Dr. Bebe Shaggy.  Limited ultrasound results d/t patient's tolerability.  Labs reveal bacterial vaginosis.  Will treat with flagyl.  Patient to follow-up with GYN.   MDM          Jimmye Norman, NP 10/18/11 [phone]

## 2011-10-18 NOTE — ED Notes (Signed)
LLQ pain started approx 1pm-LMP started 4 days ago-pain with tampon insertion last night-denies n/v/d

## 2011-10-18 NOTE — Discharge Instructions (Signed)
Bacterial Vaginosis Bacterial vaginosis is an infection of the vagina. A healthy vagina has many kinds of good germs (bacteria). Sometimes the number of good germs can change. This allows bad germs to move in and cause an infection. You may be given medicine (antibiotics) to treat the infection. Or, you may not need treatment at all. HOME CARE  Take your medicine as told. Finish them even if you start to feel better.   Do not have sex until you finish your medicine.   Do not douche.   Practice safe sex.   Tell your sex partner that you have an infection. They should see their doctor for treatment if they have problems.  GET HELP RIGHT AWAY IF:  You do not get better after 3 days of treatment.   You have grey fluid (discharge) coming from your vagina.   You have pain.   You have a temperature of 102 F (38.9 C) or higher.  MAKE SURE YOU:   Understand these instructions.   Will watch your condition.   Will get help right away if you are not doing well or get worse.  Document Released: 05/04/2008 Document Revised: 07/15/2011 Document Reviewed: 05/04/2008 [facility] Patient Information 2012 [place], [place].  If your cultures indicate additional treatment is needed, you will be contacted.  Follow-up with your gynecologist if no improvement in symptoms over the next 2-3 days.  Return if pain worsens despite treatment.

## 2011-10-19 NOTE — ED Provider Notes (Signed)
Medical screening examination/treatment/procedure(s) were conducted as a shared visit with non-physician practitioner(s) and myself.  I personally evaluated the patient during the encounter   Joya Gaskins, MD 10/19/11 [phone]

## 2012-01-14 ENCOUNTER — Encounter (HOSPITAL_COMMUNITY): Payer: Self-pay | Admitting: *Deleted

## 2012-01-14 ENCOUNTER — Emergency Department (HOSPITAL_COMMUNITY)
Admission: EM | Admit: 2012-01-14 | Discharge: 2012-01-14 | Disposition: A | Discharge status: Home / Self Care | Payer: Self-pay | Attending: Emergency Medicine | Admitting: Emergency Medicine

## 2012-01-14 DIAGNOSIS — N72 Inflammatory disease of cervix uteri: Secondary | ICD-10-CM | POA: Insufficient documentation

## 2012-01-14 DIAGNOSIS — F411 Generalized anxiety disorder: Secondary | ICD-10-CM | POA: Insufficient documentation

## 2012-01-14 DIAGNOSIS — F17200 Nicotine dependence, unspecified, uncomplicated: Secondary | ICD-10-CM | POA: Insufficient documentation

## 2012-01-14 DIAGNOSIS — R1032 Left lower quadrant pain: Secondary | ICD-10-CM | POA: Insufficient documentation

## 2012-01-14 LAB — WET PREP, GENITAL
Clue Cells Wet Prep HPF POC: NONE SEEN
Yeast Wet Prep HPF POC: NONE SEEN

## 2012-01-14 LAB — URINALYSIS, ROUTINE W REFLEX MICROSCOPIC
Leukocytes, UA: NEGATIVE
Nitrite: NEGATIVE
Specific Gravity, Urine: 1.012 (ref 1.005–1.030)
Urobilinogen, UA: 0.2 mg/dL (ref 0.0–1.0)
pH: 5.5 (ref 5.0–8.0)

## 2012-01-14 LAB — POCT I-STAT, CHEM 8
BUN: 7 mg/dL (ref 6–23)
Creatinine, Ser: 1 mg/dL (ref 0.50–1.10)
Glucose, Bld: 85 mg/dL (ref 70–99)
Potassium: 4.2 mEq/L (ref 3.5–5.1)
Sodium: 144 mEq/L (ref 135–145)

## 2012-01-14 LAB — CBC
HCT: 41.4 % (ref 36.0–46.0)
Hemoglobin: 14 g/dL (ref 12.0–15.0)
MCH: 32.7 pg (ref 26.0–34.0)
MCHC: 33.8 g/dL (ref 30.0–36.0)
RBC: 4.28 MIL/uL (ref 3.87–5.11)

## 2012-01-14 MED ORDER — NAPROXEN 500 MG PO TABS: 500.0000 mg | ORAL_TABLET | Freq: Two times a day (BID) | ORAL | Status: DC

## 2012-01-14 MED ORDER — ONDANSETRON 4 MG PO TBDP
8.0000 mg | ORAL_TABLET | Freq: Once | ORAL | Status: AC
  Administered 2012-01-14: 8 mg via ORAL
  Filled 2012-01-14: qty 2

## 2012-01-14 MED ORDER — OXYCODONE-ACETAMINOPHEN 5-325 MG PO TABS
1.0000 | ORAL_TABLET | Freq: Once | ORAL | Status: AC
  Administered 2012-01-14: 1 via ORAL
  Filled 2012-01-14: qty 1

## 2012-01-14 MED ORDER — AZITHROMYCIN 250 MG PO TABS
1000.0000 mg | ORAL_TABLET | Freq: Once | ORAL | Status: AC
  Administered 2012-01-14: 1000 mg via ORAL
  Filled 2012-01-14: qty 4

## 2012-01-14 MED ORDER — CEFTRIAXONE SODIUM 250 MG IJ SOLR
250.0000 mg | INTRAMUSCULAR | Status: DC
  Administered 2012-01-14: 250 mg via INTRAMUSCULAR
  Filled 2012-01-14: qty 250

## 2012-01-14 MED ORDER — OXYCODONE-ACETAMINOPHEN 5-325 MG PO TABS: 1.0000 | ORAL_TABLET | Freq: Four times a day (QID) | ORAL | Status: AC | PRN

## 2012-01-14 MED ORDER — LIDOCAINE HCL (PF) 1 % IJ SOLN
INTRAMUSCULAR | Status: AC
  Filled 2012-01-14: qty 5

## 2012-01-14 NOTE — Discharge Instructions (Signed)
Cervicitis   Cervicitis is a soreness and swelling (inflammation) of the cervix. Your cervix is located at the bottom of your uterus which opens up to the vagina.   CAUSES   Sexually transmitted infections (STIs).   Allergic reaction.   Medicines or birth control devices that are put in the vagina.   Injury to the cervix.   Bacterial infections.   SYMPTOMS   There may be no symptoms. If symptoms occur, they may include:   Grey, white, yellow, or bad smelling vaginal discharge.   Pain or itching of the area outside the vagina.   Painful sexual intercourse.   Lower abdominal or lower back pain, especially during intercourse.   Frequent urination.   Abnormal vaginal bleeding between periods, after sexual intercourse, or after menopause.   Pressure or a heavy feeling in the pelvis.   DIAGNOSIS   Diagnosis is made after a pelvic exam. Other tests may include:   Examination of any discharge under a microscope (wet prep).   A Pap test.   TREATMENT   Treatment will depend on the cause of cervicitis. If it is caused by an STI, both you and your partner will need to be treated. Antibiotic medicines will be given.   HOME CARE INSTRUCTIONS   Do not have sexual intercourse until your caregiver says it is okay.   Do not have sexual intercourse until your partner has been treated if your cervicitis is caused by an STI.   Take your antibiotics as directed. Finish them even if you start to feel better.   SEEK IMMEDIATE MEDICAL CARE IF:   Your symptoms come back.   You have a fever.   You experience any problems that may be related to the medicine you are taking.   MAKE SURE YOU:   Understand these instructions.   Will watch your condition.   Will get help right away if you are not doing well or get worse.   Document Released: 07/26/2005 Document Revised: 07/15/2011 Document Reviewed: 02/22/2011   [employer] Patient Information 2012 [employer].

## 2012-01-14 NOTE — ED Provider Notes (Signed)
History     CSN: [id number]  Arrival date & time 01/14/12  1135   First MD Initiated Contact with Patient 01/14/12 1202      Chief Complaint  Patient presents with  . Abdominal Pain     HPI The patient presented to the emergency room with sharp left lower abdominal pain. Patient also states that her menstrual period is late. She has taken a home pregnancy tests. One was positive and subsequent ones have been negative. The pain has been increasing. She has not had any fevers or vomiting.  The pain is located in the LLQ.  Severe at 9/10.  No dysuria.  No diarrhea.  No prior history of same. Past Medical History  Diagnosis Date  . Anxiety     Past Surgical History  Procedure Date  . Tonsillectomy     Family History  Problem Relation Age of Onset  . Diabetes Neg Hx   . Heart attack Neg Hx   . Hyperlipidemia Neg Hx   . Hypertension Neg Hx   . Sudden death Neg Hx     History  Substance Use Topics  . Smoking status: Current Some Day Smoker  . Smokeless tobacco: Not on file  . Alcohol Use: 0.0 oz/week    OB History    Grav Para Term Preterm Abortions TAB SAB Ect Mult Living                  Review of Systems  All other systems reviewed and are negative.    Allergies  Nutritional supplements  Home Medications  No current outpatient prescriptions on file.  BP 97/62  Pulse 94  Temp(Src) 98.2 F (36.8 C) (Oral)  Resp 14  Ht 5\' 4"  (1.626 m)  Wt 125 lb (56.7 kg)  BMI 21.46 kg/m2  SpO2 100%  Physical Exam  Nursing note and vitals reviewed. Constitutional: She appears well-developed and well-nourished. No distress.  HENT:  Head: Normocephalic and atraumatic.  Right Ear: External ear normal.  Left Ear: External ear normal.  Eyes: Conjunctivae are normal. Right eye exhibits no discharge. Left eye exhibits no discharge. No scleral icterus.  Neck: Neck supple. No tracheal deviation present.  Cardiovascular: Normal rate, regular rhythm and intact distal pulses.    Pulmonary/Chest: Effort normal and breath sounds normal. No stridor. No respiratory distress. She has no wheezes. She has no rales.  Abdominal: Soft. Bowel sounds are normal. She exhibits no distension. There is no tenderness. There is no rebound and no guarding.  Genitourinary: Pelvic exam was performed with patient supine. There is no rash, tenderness or lesion on the right labia. There is no rash, tenderness or lesion on the left labia. Uterus is tender. Uterus is not enlarged. Cervix exhibits motion tenderness. Cervix exhibits no discharge and no friability. Right adnexum displays no mass, no tenderness and no fullness. Left adnexum displays tenderness. Left adnexum displays no mass and no fullness. Vaginal discharge found.  Musculoskeletal: She exhibits no edema and no tenderness.  Neurological: She is alert. She has normal strength. No sensory deficit. Cranial nerve deficit:  no gross defecits noted. She exhibits normal muscle tone. She displays no seizure activity. Coordination normal.  Skin: Skin is warm and dry. No rash noted.  Psychiatric: She has a normal mood and affect.    ED Course  Procedures (including critical care time)  Labs Reviewed  CBC - Abnormal; Notable for the following:    Platelets 135 (*)    All other components within normal  limits  WET PREP, GENITAL - Abnormal; Notable for the following:    WBC, Wet Prep HPF POC MANY (*)    All other components within normal limits  URINALYSIS, ROUTINE W REFLEX MICROSCOPIC  POCT PREGNANCY, URINE  POCT I-STAT, CHEM 8  GC/CHLAMYDIA PROBE AMP, GENITAL   No results found.   1. Cervicitis       MDM  The patient's symptoms are consistent with a cervicitis. She has mucopurulent discharge and cervical tenderness.  I doubt tubo-ovarian abscess based on the exam. Patient will be started on antibiotics to cover for gonorrhea and Chlamydia.  We'll prescribe her pain medications and encouraged her to follow up with her primary Dr.  next week to make her symptoms have resolved. She has been provided discharged short trimmed on cervicitis include precautions and warning signs        Kirsten Kras, MD 01/14/12 1352

## 2012-01-14 NOTE — ED Provider Notes (Deleted)
MSE was initiated and I personally evaluated the patient and placed orders (if any) at  12:01 PM on January 14, 2012. The patient presented to the emergency room with sharp left lower abdominal pain. Patient also states that her menstrual period is late. She has taken a home pregnancy tests. One was positive and subsequent ones have been negative. The pain has been increasing. She has not had any fevers or vomiting On exam: No acute distress, moderate tenderness left lower quadrant no rebound or guarding.  Initial labs have been ordered. Patient will be moved to the main ED The remainder of the MSE may be completed by another ED provider.    [name], MD 01/14/12 [phone]

## 2012-01-14 NOTE — ED Notes (Signed)
Patient reports she is late on her period.  She has taken 3 preg tests that have been positive and negative.  She is having left sided lower abd pain for the past 2 days.  Today her pain is worse

## 2012-01-15 LAB — GC/CHLAMYDIA PROBE AMP, GENITAL: Chlamydia, DNA Probe: NEGATIVE

## 2012-01-19 ENCOUNTER — Emergency Department (HOSPITAL_BASED_OUTPATIENT_CLINIC_OR_DEPARTMENT_OTHER)
Admission: EM | Admit: 2012-01-19 | Discharge: 2012-01-19 | Disposition: A | Discharge status: Home / Self Care | Payer: Self-pay | Attending: Emergency Medicine | Admitting: Emergency Medicine

## 2012-01-19 ENCOUNTER — Emergency Department (HOSPITAL_BASED_OUTPATIENT_CLINIC_OR_DEPARTMENT_OTHER): Payer: Self-pay

## 2012-01-19 ENCOUNTER — Encounter (HOSPITAL_BASED_OUTPATIENT_CLINIC_OR_DEPARTMENT_OTHER): Payer: Self-pay | Admitting: *Deleted

## 2012-01-19 DIAGNOSIS — R109 Unspecified abdominal pain: Secondary | ICD-10-CM | POA: Insufficient documentation

## 2012-01-19 DIAGNOSIS — F17200 Nicotine dependence, unspecified, uncomplicated: Secondary | ICD-10-CM | POA: Insufficient documentation

## 2012-01-19 DIAGNOSIS — Z79899 Other long term (current) drug therapy: Secondary | ICD-10-CM | POA: Insufficient documentation

## 2012-01-19 DIAGNOSIS — N921 Excessive and frequent menstruation with irregular cycle: Secondary | ICD-10-CM | POA: Insufficient documentation

## 2012-01-19 LAB — URINALYSIS, ROUTINE W REFLEX MICROSCOPIC
Ketones, ur: 40 mg/dL — AB
Leukocytes, UA: NEGATIVE
Nitrite: NEGATIVE
Protein, ur: NEGATIVE mg/dL
Urobilinogen, UA: 0.2 mg/dL (ref 0.0–1.0)

## 2012-01-19 LAB — CBC
MCH: 33 pg (ref 26.0–34.0)
MCHC: 34.1 g/dL (ref 30.0–36.0)
MCV: 96.7 fL (ref 78.0–100.0)
Platelets: 123 10*3/uL — ABNORMAL LOW (ref 150–400)
RBC: 4.24 MIL/uL (ref 3.87–5.11)
RDW: 12.1 % (ref 11.5–15.5)

## 2012-01-19 MED ORDER — IBUPROFEN 800 MG PO TABS
800.0000 mg | ORAL_TABLET | Freq: Once | ORAL | Status: AC
  Administered 2012-01-19: 800 mg via ORAL
  Filled 2012-01-19: qty 1

## 2012-01-19 NOTE — Discharge Instructions (Signed)

## 2012-01-19 NOTE — ED Notes (Signed)
Patient states she has had lower abdominal pain for the last 5 days.  Was seen at [facility] and diagnosed with vaginal infection.  States she continued to have abdominal pain.  3 days ago, she developed heavy vaginal bleeding x 1 day and then became a "regular" period.  Today, the heavy vaginal bleeding with clots, states she has changed her super tampon 5-6 times since 0700.  States she has had a late menstrual cycle 6-7 days.  LMP 12/09/11.  Negative pregnancy 5 days ago.

## 2012-01-19 NOTE — ED Notes (Signed)
MD at bedside. Chaperone present for pelvic

## 2012-01-19 NOTE — ED Notes (Signed)
Pelvic cart present. Pt informed of need for clean catch urine sample.

## 2012-01-19 NOTE — ED Provider Notes (Signed)
Medical screening examination/treatment/procedure(s) were performed by non-physician practitioner and as supervising physician I was immediately available for consultation/collaboration.   Carleene Cooper III, MD 01/19/12 2028

## 2012-01-19 NOTE — ED Provider Notes (Signed)
History     CSN: [id number]  Arrival date & time 01/19/12  1032   First MD Initiated Contact with Patient 01/19/12 1205      Chief Complaint  Patient presents with  . Vaginal Bleeding    (Consider location/radiation/quality/duration/timing/severity/associated sxs/prior treatment) HPI Comments: Pt states that in the last 2 days her period has been extremely heavy:pt states that she can use up to one tampon per hour:pt states that she was seen 5 days ago for abdominal pain and was diagnosed with cervicitis  Patient is a 31 y.o. female presenting with vaginal bleeding.  Vaginal Bleeding This is a new problem. The current episode started in the past 7 days. The problem occurs constantly. The problem has been gradually worsening. Associated symptoms include abdominal pain. Pertinent negatives include no fever or urinary symptoms. Nothing aggravates the symptoms. She has tried nothing for the symptoms.    Past Medical History  Diagnosis Date  . Anxiety     Past Surgical History  Procedure Date  . Tonsillectomy     Family History  Problem Relation Age of Onset  . Diabetes Neg Hx   . Heart attack Neg Hx   . Hyperlipidemia Neg Hx   . Hypertension Neg Hx   . Sudden death Neg Hx     History  Substance Use Topics  . Smoking status: Current Some Day Smoker  . Smokeless tobacco: Not on file  . Alcohol Use: 0.0 oz/week    OB History    Grav Para Term Preterm Abortions TAB SAB Ect Mult Living                  Review of Systems  Constitutional: Negative for fever.  Respiratory: Negative.   Cardiovascular: Negative.   Gastrointestinal: Positive for abdominal pain.  Genitourinary: Positive for vaginal bleeding.    Allergies  Bee venom and Nutritional supplements  Home Medications   Current Outpatient Rx  Name Route Sig Dispense Refill  . NAPROXEN 500 MG PO TABS Oral Take 1 tablet (500 mg total) by mouth 2 (two) times daily. 30 tablet 0  . OXYCODONE-ACETAMINOPHEN  5-325 MG PO TABS Oral Take 1 tablet by mouth every 6 (six) hours as needed for pain. 12 tablet 0    BP 138/92  Pulse 73  Temp 98 F (36.7 C)  Resp 20  Ht 5\' 4"  (1.626 m)  Wt 125 lb (56.7 kg)  BMI 21.46 kg/m2  SpO2 100%  LMP 12/09/2011  Physical Exam  Nursing note and vitals reviewed. Constitutional: She is oriented to person, place, and time. She appears well-developed and well-nourished.  HENT:  Head: Normocephalic and atraumatic.  Cardiovascular: Normal rate and regular rhythm.   Pulmonary/Chest: Effort normal and breath sounds normal.  Abdominal: Soft. Bowel sounds are normal. There is no tenderness.  Genitourinary:       Left adnexal tenderness:pt has mild blood noted in vault  Musculoskeletal: Normal range of motion.  Neurological: She is alert and oriented to person, place, and time.  Skin: Skin is warm and dry.  Psychiatric: She has a normal mood and affect.    ED Course  Procedures (including critical care time)  Labs Reviewed  URINALYSIS, ROUTINE W REFLEX MICROSCOPIC - Abnormal; Notable for the following:    Color, Urine AMBER (*)  BIOCHEMICALS MAY BE AFFECTED BY COLOR   Bilirubin Urine SMALL (*)     Ketones, ur 40 (*)     All other components within normal limits  CBC - Abnormal;  Notable for the following:    Platelets 123 (*)     All other components within normal limits  PREGNANCY, URINE   US Transvaginal Non-ob  01/19/2012  *RADIOLOGY REPORT*  Clinical Data: Heavy bleeding and left-sided pain.  TRANSABDOMINAL AND TRANSVAGINAL ULTRASOUND OF PELVIS Technique:  Both transabdominal and transvaginal ultrasound examinations of the pelvis were performed. Transabdominal technique was performed for global imaging of the pelvis including uterus, ovaries, adnexal regions, and pelvic cul-de-sac.  It was necessary to proceed with endovaginal exam following the transabdominal exam to visualize the uterus, endometrium, ovaries, and adnexa.  Comparison:  Pelvic ultrasound  10/18/2011  Findings:  Uterus: Normal in size and appearanceanteverted.  Normal in echotexture.  No focal uterine mass.  Endometrium: Normal in thickness and appearance.  Measures 2 mm.  Right ovary:  Normal appearance.  No adnexal mass.  Measures 3.4 x 2.1 x 2.6 cm.  Left ovary: Normal appearance.  No adnexal mass.  Measures 2.5 x 1.8 x 2.2 cm.  Other findings: No free fluid. Parauterine vessels on the left appear prominent, and demonstrate vascular flow on Doppler imaging.  IMPRESSION:  1.No evidence of pelvic mass.  Normal appearance of the uterus and ovaries.  2.  Prominent left paraovarian vessels incidentally noted. Although nonspecific, this finding can be seen in patients with pelvic congestion syndrome.  Original Report Authenticated By: Britta Mccreedy, M.D.   US Pelvis Limited  01/19/2012  *RADIOLOGY REPORT*  Clinical Data: Heavy bleeding and left-sided pain.  TRANSABDOMINAL AND TRANSVAGINAL ULTRASOUND OF PELVIS Technique:  Both transabdominal and transvaginal ultrasound examinations of the pelvis were performed. Transabdominal technique was performed for global imaging of the pelvis including uterus, ovaries, adnexal regions, and pelvic cul-de-sac.  It was necessary to proceed with endovaginal exam following the transabdominal exam to visualize the uterus, endometrium, ovaries, and adnexa.  Comparison:  Pelvic ultrasound 10/18/2011  Findings:  Uterus: Normal in size and appearanceanteverted.  Normal in echotexture.  No focal uterine mass.  Endometrium: Normal in thickness and appearance.  Measures 2 mm.  Right ovary:  Normal appearance.  No adnexal mass.  Measures 3.4 x 2.1 x 2.6 cm.  Left ovary: Normal appearance.  No adnexal mass.  Measures 2.5 x 1.8 x 2.2 cm.  Other findings: No free fluid. Parauterine vessels on the left appear prominent, and demonstrate vascular flow on Doppler imaging.  IMPRESSION:  1.No evidence of pelvic mass.  Normal appearance of the uterus and ovaries.  2.  Prominent left  paraovarian vessels incidentally noted. Although nonspecific, this finding can be seen in patients with pelvic congestion syndrome.  Original Report Authenticated By: Britta Mccreedy, M.D.     1. Metrorrhagia       MDM  No acute finding for bleeding:pt okay to follow up with ob as needed:bleeding has not changed hgb:don't think any meds are needed at this time        Teressa Lower, NP 01/19/12 1417

## 2012-03-24 ENCOUNTER — Encounter (HOSPITAL_BASED_OUTPATIENT_CLINIC_OR_DEPARTMENT_OTHER): Payer: Self-pay | Admitting: Family Medicine

## 2012-03-24 ENCOUNTER — Emergency Department (HOSPITAL_BASED_OUTPATIENT_CLINIC_OR_DEPARTMENT_OTHER)
Admission: EM | Admit: 2012-03-24 | Discharge: 2012-03-24 | Disposition: A | Discharge status: Home / Self Care | Payer: Self-pay | Attending: Emergency Medicine | Admitting: Emergency Medicine

## 2012-03-24 DIAGNOSIS — K120 Recurrent oral aphthae: Secondary | ICD-10-CM | POA: Insufficient documentation

## 2012-03-24 DIAGNOSIS — F17200 Nicotine dependence, unspecified, uncomplicated: Secondary | ICD-10-CM | POA: Insufficient documentation

## 2012-03-24 NOTE — ED Notes (Signed)
Pt c/o mouth "hurting and feels hot inside" since last night. Pt denies injury. Pt denies dental problems.

## 2012-03-24 NOTE — ED Provider Notes (Signed)
History     CSN: [id number]  Arrival date & time 03/24/12  4696   First MD Initiated Contact with Patient 03/24/12 1034      Chief Complaint  Patient presents with  . Oral Pain    (Consider location/radiation/quality/duration/timing/severity/associated sxs/prior treatment) HPI Patient presents with pain along her upper gum line. She is concerned do to pain in that area which began yesterday. She recently had her lower lip pierced but there is no pain or swelling around that area. She has no dental pain. There is no swelling under her tongue and no sore throat. She has no fever.There are no other associated systemic symptoms, there are no other alleviating or modifying factors.   Past Medical History  Diagnosis Date  . Anxiety     Past Surgical History  Procedure Date  . Tonsillectomy     Family History  Problem Relation Age of Onset  . Diabetes Neg Hx   . Heart attack Neg Hx   . Hyperlipidemia Neg Hx   . Hypertension Neg Hx   . Sudden death Neg Hx     History  Substance Use Topics  . Smoking status: Current Some Day Smoker  . Smokeless tobacco: Not on file  . Alcohol Use: 0.0 oz/week    OB History    Grav Para Term Preterm Abortions TAB SAB Ect Mult Living                  Review of Systems ROS reviewed and all otherwise negative except for mentioned in HPI  Allergies  Bee venom and Nutritional supplements  Home Medications   Current Outpatient Rx  Name Route Sig Dispense Refill  . NAPROXEN 500 MG PO TABS Oral Take 1 tablet (500 mg total) by mouth 2 (two) times daily. 30 tablet 0    BP 135/88  Pulse 61  Temp 98.4 F (36.9 C) (Oral)  Resp 16  SpO2 100%  LMP 03/14/2012 Vitals reviewed Physical Exam Physical Examination: General appearance - alert, well appearing, and in no distress Mental status - alert, oriented to person, place, and time Eyes - no conjunctival injection, no scleral icterus Mouth - MMM, piercing on right lower lip without  associated erythema or drainage, nontender, slight area of apthous ulcer on upper gingiva which is tender to palpation, no significant gingivitis, no swelling under the tongue Neck - supple, no significant adenopathy Chest - symmetric chest rise, no increased respiratory effort Heart - normal rate, regular rhythm, normal S1, S2, no murmurs, rubs, clicks or gallops Skin - normal coloration and turgor, no rashes Psych- anxious  ED Course  Procedures (including critical care time)  Labs Reviewed - No data to display No results found.   1. Aphthous ulcer of mouth       MDM  Pt with apthous ulcer on upper frontal gingiva.  No evidence of infection of recent piercing on exam.  Pt is anxious about her symptoms, no evidence of emergent condition warranting further evaluation today.  Recommended topical OTC treatment to soothe pain of apthous ulcer.  Discharged with strict return precautions.  Pt agreeable with plan.        [name], MD 03/25/12 [phone]

## 2012-05-08 ENCOUNTER — Emergency Department (HOSPITAL_BASED_OUTPATIENT_CLINIC_OR_DEPARTMENT_OTHER)
Admission: EM | Admit: 2012-05-08 | Discharge: 2012-05-08 | Disposition: A | Discharge status: Home / Self Care | Payer: Self-pay | Attending: Emergency Medicine | Admitting: Emergency Medicine

## 2012-05-08 ENCOUNTER — Encounter (HOSPITAL_BASED_OUTPATIENT_CLINIC_OR_DEPARTMENT_OTHER): Payer: Self-pay | Admitting: Emergency Medicine

## 2012-05-08 DIAGNOSIS — Z91038 Other insect allergy status: Secondary | ICD-10-CM | POA: Insufficient documentation

## 2012-05-08 DIAGNOSIS — K143 Hypertrophy of tongue papillae: Secondary | ICD-10-CM | POA: Insufficient documentation

## 2012-05-08 DIAGNOSIS — L089 Local infection of the skin and subcutaneous tissue, unspecified: Secondary | ICD-10-CM | POA: Insufficient documentation

## 2012-05-08 DIAGNOSIS — F17200 Nicotine dependence, unspecified, uncomplicated: Secondary | ICD-10-CM | POA: Insufficient documentation

## 2012-05-08 DIAGNOSIS — K130 Diseases of lips: Secondary | ICD-10-CM

## 2012-05-08 DIAGNOSIS — F411 Generalized anxiety disorder: Secondary | ICD-10-CM | POA: Insufficient documentation

## 2012-05-08 DIAGNOSIS — X58XXXA Exposure to other specified factors, initial encounter: Secondary | ICD-10-CM | POA: Insufficient documentation

## 2012-05-08 MED ORDER — CLINDAMYCIN HCL 150 MG PO CAPS
300.0000 mg | ORAL_CAPSULE | Freq: Once | ORAL | Status: AC
  Administered 2012-05-08: 300 mg via ORAL
  Filled 2012-05-08: qty 2

## 2012-05-08 MED ORDER — OXYCODONE-ACETAMINOPHEN 5-325 MG PO TABS
1.0000 | ORAL_TABLET | Freq: Once | ORAL | Status: AC
  Administered 2012-05-08: 1 via ORAL
  Filled 2012-05-08 (×2): qty 1

## 2012-05-08 MED ORDER — OXYCODONE-ACETAMINOPHEN 5-325 MG PO TABS: 1.0000 | ORAL_TABLET | Freq: Four times a day (QID) | ORAL | Status: DC | PRN

## 2012-05-08 MED ORDER — CLINDAMYCIN HCL 300 MG PO CAPS: 300.0000 mg | ORAL_CAPSULE | Freq: Three times a day (TID) | ORAL | Status: DC

## 2012-05-08 NOTE — ED Notes (Signed)
Took out pts lip ring, no complications, bleeding or drainage.

## 2012-05-08 NOTE — ED Notes (Signed)
Infected lower lip from piercing

## 2012-05-08 NOTE — ED Notes (Signed)
Pt. Discharged after lip ring removed and ice pk on R lower lip.

## 2012-05-08 NOTE — ED Provider Notes (Signed)
History   This chart was scribed for [name], MD by Sofie Rower. The patient was seen in room MH06/MH06 and the patient's care was started at 4:15PM    CSN: [id number]  Arrival date & time 05/08/12  1558   First MD Initiated Contact with Patient 05/08/12 1615      Chief Complaint  Patient presents with  . Oral Swelling    (Consider location/radiation/quality/duration/timing/severity/associated sxs/prior treatment) Patient is a 31 y.o. female presenting with tooth pain. The history is provided by the patient. No language interpreter was used.  Dental PainThe primary symptoms include mouth pain and oral bleeding. Primary symptoms do not include fever. The symptoms began 6 to 12 hours ago. The symptoms are worsening. The symptoms are new. The symptoms occur constantly.  Mouth pain began 6 - 12 hours ago. Mouth pain occurs constantly. Mouth pain is worsening. Affected locations include: lip(s).  Oral bleeding began 6 - 12 hours ago. The bleeding is unchanged. The bleeding is new. Location of the bleeding: lip(s).    Kirsten [name] is a 31 y.o. female who presents to the Emergency Department complaining of  sudden, progressively worsening, oral swelling located at the lower lip, onset today (AM) with associated symptoms of oral bleeding. The pt reports she had her lip piercing replaced yesterday, 05/07/12, and is currently experiencing some discomfort and minor bleeding at the site of the piercing. Modifying factors include taking naproxen which does not provide relief of the oral swelling. The pt has a hx of tonsillectomy and anxiety.   The pt denies any fever associated with the oral swelling.   The pt is a current smoker, in addition to drinking alcohol occasionally.    PCP is Dr. Tiburcio Pea.    Past Medical History  Diagnosis Date  . Anxiety     Past Surgical History  Procedure Date  . Tonsillectomy     Family History  Problem Relation Age of Onset  . Diabetes Neg Hx     . Heart attack Neg Hx   . Hyperlipidemia Neg Hx   . Hypertension Neg Hx   . Sudden death Neg Hx     History  Substance Use Topics  . Smoking status: Current Some Day Smoker  . Smokeless tobacco: Not on file  . Alcohol Use: 0.0 oz/week    OB History    Grav Para Term Preterm Abortions TAB SAB Ect Mult Living                  Review of Systems  Constitutional: Negative for fever.  All other systems reviewed and are negative.    Allergies  Bee venom and Nutritional supplements  Home Medications   Current Outpatient Rx  Name Route Sig Dispense Refill  . CLINDAMYCIN HCL 300 MG PO CAPS Oral Take 1 capsule (300 mg total) by mouth 3 (three) times daily. 21 capsule 0  . NAPROXEN 500 MG PO TABS Oral Take 1 tablet (500 mg total) by mouth 2 (two) times daily. 30 tablet 0  . OXYCODONE-ACETAMINOPHEN 5-325 MG PO TABS Oral Take 1-2 tablets by mouth every 6 (six) hours as needed for pain. 15 tablet 0    BP 114/71  Pulse 73  Temp 98.5 F (36.9 C) (Oral)  Resp 18  Ht 5\' 4"  (1.626 m)  Wt 130 lb (58.968 kg)  BMI 22.31 kg/m2  Physical Exam  Nursing note and vitals reviewed. Constitutional: She is oriented to person, place, and time. She appears well-developed and  well-nourished.  HENT:  Head: Atraumatic.  Right Ear: External ear normal.  Left Ear: External ear normal.  Nose: Nose normal.  Mouth/Throat: No posterior oropharyngeal edema.       Swelling located at the right lower lip. No fluctuance, no drainage. Black coating located at the surface of the tongue, diffusely.   Eyes: Conjunctivae normal and EOM are normal.  Neck: Normal range of motion. Neck supple.  Pulmonary/Chest: Effort normal.  Musculoskeletal: Normal range of motion.  Lymphadenopathy:    She has no cervical adenopathy.  Neurological: She is alert and oriented to person, place, and time.  Skin: Skin is warm and dry.  Psychiatric: She has a normal mood and affect. Her behavior is normal.    ED Course   Procedures (including critical care time)  COORDINATION OF CARE:    4:27PM- Pain management and treatment plan discussed with patient. Pt agrees to treatment.   Labs Reviewed - No data to display No results found.   1. Pierced lip infection       MDM  Pt with pain and swelling at area around lip piercing.  Piercing removed.  Ice pack applied.  Also started on antibiotics.  Also has what appears to be black hairy tongue- does not appear to be related to area of infection of lip.  Discharged with strict return precautions.  Pt agreeable with plan.   I personally performed the services described in this documentation, which was scribed in my presence. The recorded information has been reviewed and considered.    [name], MD 05/08/12 [phone]

## 2012-05-10 ENCOUNTER — Emergency Department (HOSPITAL_BASED_OUTPATIENT_CLINIC_OR_DEPARTMENT_OTHER)
Admission: EM | Admit: 2012-05-10 | Discharge: 2012-05-10 | Disposition: A | Discharge status: Home / Self Care | Payer: Self-pay | Attending: Emergency Medicine | Admitting: Emergency Medicine

## 2012-05-10 ENCOUNTER — Encounter (HOSPITAL_BASED_OUTPATIENT_CLINIC_OR_DEPARTMENT_OTHER): Payer: Self-pay | Admitting: *Deleted

## 2012-05-10 DIAGNOSIS — R220 Localized swelling, mass and lump, head: Secondary | ICD-10-CM | POA: Insufficient documentation

## 2012-05-10 DIAGNOSIS — F17200 Nicotine dependence, unspecified, uncomplicated: Secondary | ICD-10-CM | POA: Insufficient documentation

## 2012-05-10 DIAGNOSIS — Z91038 Other insect allergy status: Secondary | ICD-10-CM | POA: Insufficient documentation

## 2012-05-10 MED ORDER — SULFAMETHOXAZOLE-TRIMETHOPRIM 800-160 MG PO TABS: 1.0000 | ORAL_TABLET | Freq: Two times a day (BID) | ORAL | Status: DC

## 2012-05-10 MED ORDER — PREDNISONE 10 MG PO TABS: 50.0000 mg | ORAL_TABLET | Freq: Every day | ORAL | Status: DC

## 2012-05-10 MED ORDER — DIPHENHYDRAMINE HCL 25 MG PO CAPS
25.0000 mg | ORAL_CAPSULE | Freq: Once | ORAL | Status: AC
  Administered 2012-05-10: 25 mg via ORAL
  Filled 2012-05-10: qty 1

## 2012-05-10 MED ORDER — DIPHENHYDRAMINE HCL 25 MG PO TABS: 50.0000 mg | ORAL_TABLET | ORAL | Status: DC | PRN

## 2012-05-10 MED ORDER — PREDNISONE 20 MG PO TABS
40.0000 mg | ORAL_TABLET | Freq: Once | ORAL | Status: AC
  Administered 2012-05-10: 40 mg via ORAL
  Filled 2012-05-10: qty 2

## 2012-05-10 NOTE — ED Provider Notes (Signed)
  I performed a history and physical examination of Kirsten [name] and discussed her management withTia Oliveri, PA C  I agree with the history, physical, assessment, and plan of care, with the following exceptions: None  I was present for the following procedures: None Time Spent in Critical Care of the patient: None Time spent in discussions with the patient and family10 Kirsten Mcgaugh Corlis Leak, MD 05/10/12 1524

## 2012-05-10 NOTE — ED Provider Notes (Signed)
History     CSN: [id number]  Arrival date & time 05/10/12  1302   First MD Initiated Contact with Patient 05/10/12 1318      Chief Complaint  Patient presents with  . Facial Swelling    (Consider location/radiation/quality/duration/timing/severity/associated sxs/prior treatment) HPI Comments: Patient presents with a chief complaint of lip and facial swelling. Patient was seen at this ED on 05/08/12 for cellulitis of the lip secondary to infected lip piercing. Jewelry was removed and patient discharged on Clindamycin. Patient return because she states that her lip has improved, but she has noticed new swelling of her cheeks and neck, that began this morning. Patient denies allergies to antibiotics and has taken Clindamycin in the past, without complication. Denies chills or diaphoresis, but reports subjective fevers. Denies SOB, wheezing, trouble swallowing. Denies NVD or abdominal pain. Denies rash.  The history is provided by the patient. No language interpreter was used.    Past Medical History  Diagnosis Date  . Anxiety     Past Surgical History  Procedure Date  . Tonsillectomy     Family History  Problem Relation Age of Onset  . Diabetes Neg Hx   . Heart attack Neg Hx   . Hyperlipidemia Neg Hx   . Hypertension Neg Hx   . Sudden death Neg Hx     History  Substance Use Topics  . Smoking status: Current Some Day Smoker  . Smokeless tobacco: Not on file  . Alcohol Use: 0.0 oz/week    OB History    Grav Para Term Preterm Abortions TAB SAB Ect Mult Living                  Review of Systems  Constitutional: Positive for fever. Negative for chills and diaphoresis.  HENT: Negative for trouble swallowing.   Respiratory: Negative for shortness of breath and wheezing.   Skin: Negative for rash.    Allergies  Bee venom and Nutritional supplements  Home Medications   Current Outpatient Rx  Name Route Sig Dispense Refill  . CLINDAMYCIN HCL 300 MG PO CAPS Oral  Take 1 capsule (300 mg total) by mouth 3 (three) times daily. 21 capsule 0  . DIPHENHYDRAMINE HCL 25 MG PO TABS Oral Take 2 tablets (50 mg total) by mouth every 4 (four) hours as needed for itching. 20 tablet 0  . NAPROXEN 500 MG PO TABS Oral Take 1 tablet (500 mg total) by mouth 2 (two) times daily. 30 tablet 0  . OXYCODONE-ACETAMINOPHEN 5-325 MG PO TABS Oral Take 1-2 tablets by mouth every 6 (six) hours as needed for pain. 15 tablet 0  . PREDNISONE 10 MG PO TABS Oral Take 5 tablets (50 mg total) by mouth daily. Take one tablet daily for 5 days 5 tablet 0  . SULFAMETHOXAZOLE-TRIMETHOPRIM 800-160 MG PO TABS Oral Take 1 tablet by mouth every 12 (twelve) hours. 20 tablet 0    BP 123/71  Pulse 71  Temp 98.7 F (37.1 C)  Resp 16  Wt 130 lb (58.968 kg)  SpO2 99%  Physical Exam  Nursing note and vitals reviewed. Constitutional: She appears well-developed and well-nourished. No distress.  HENT:  Head: Normocephalic and atraumatic.  Mouth/Throat: Oropharynx is clear and moist. No oropharyngeal exudate.       No areas erythema, induration, or, fluctuance appreciation in the oropharnyx. No dental caries noted. There is a small non-tender areas of scar tissue on the lateral portion of the right lower lip.  Eyes: Conjunctivae normal and  EOM are normal. No scleral icterus.  Neck: Normal range of motion. Neck supple.  Cardiovascular: Normal rate, regular rhythm and normal heart sounds.   Pulmonary/Chest: Effort normal and breath sounds normal. She has no wheezes.  Abdominal: Soft. Bowel sounds are normal. There is no tenderness.  Lymphadenopathy:    She has no cervical adenopathy.  Neurological: She is alert.  Skin: Skin is warm and dry.    ED Course  Procedures (including critical care time)  Labs Reviewed - No data to display No results found.   1. Facial swelling       MDM  Patient seen on 05/08/12 at this ED and diagnosed with cellulitis of the right lower lip. Patient had an  infected lip piercing which was removed and Clindamycin started in ED. Patient discharged on clindamycin and has been adherent since discharge. Patient return with swelling of her cheeks. Patient seen with Dr. Rosalia Hammers and we both agree that swelling is most likely due to antibiotic. Patient given benadryl and prednisone in ED and discharged on same with referral to ENT for follow-up. Return precaution given verbally and in discharge summary. No red flags for system anaphylaxis.        Pixie Casino, PA-C 05/10/12 1502

## 2012-05-10 NOTE — ED Notes (Addendum)
Pt sts seen last week for lip infection from piercing.  Pt sts continued lip swelling and new swelling to face and inside mouth.  Pt sts a "white head" formed at the site of the piercing and a white puss came out after applying a warm compress on Tuesday.  Pt denies difficulty breathing and denies pain.

## 2012-05-10 NOTE — ED Notes (Signed)
Pt last seen here  Monday for lip infection , return today for cont swelling. Taking clindamycin

## 2012-05-15 ENCOUNTER — Emergency Department (HOSPITAL_COMMUNITY)
Admission: EM | Admit: 2012-05-15 | Discharge: 2012-05-16 | Disposition: A | Discharge status: Home / Self Care | Payer: Self-pay | Attending: Emergency Medicine | Admitting: Emergency Medicine

## 2012-05-15 ENCOUNTER — Encounter (HOSPITAL_COMMUNITY): Payer: Self-pay | Admitting: *Deleted

## 2012-05-15 DIAGNOSIS — IMO0001 Reserved for inherently not codable concepts without codable children: Secondary | ICD-10-CM | POA: Insufficient documentation

## 2012-05-15 DIAGNOSIS — R232 Flushing: Secondary | ICD-10-CM | POA: Insufficient documentation

## 2012-05-15 DIAGNOSIS — F17200 Nicotine dependence, unspecified, uncomplicated: Secondary | ICD-10-CM | POA: Insufficient documentation

## 2012-05-15 DIAGNOSIS — M791 Myalgia, unspecified site: Secondary | ICD-10-CM

## 2012-05-15 LAB — CBC WITH DIFFERENTIAL/PLATELET
Basophils Absolute: 0.1 10*3/uL (ref 0.0–0.1)
Basophils Relative: 1 % (ref 0–1)
HCT: 41.7 % (ref 36.0–46.0)
MCHC: 33.8 g/dL (ref 30.0–36.0)
Monocytes Absolute: 0.9 10*3/uL (ref 0.1–1.0)
Neutro Abs: 6.1 10*3/uL (ref 1.7–7.7)
Platelets: 198 10*3/uL (ref 150–400)
RDW: 12.9 % (ref 11.5–15.5)

## 2012-05-15 LAB — BASIC METABOLIC PANEL
Calcium: 9.7 mg/dL (ref 8.4–10.5)
Chloride: 102 mEq/L (ref 96–112)
Creatinine, Ser: 0.73 mg/dL (ref 0.50–1.10)
GFR calc Af Amer: >90 mL/min (ref >90)
Sodium: 140 mEq/L (ref 135–145)

## 2012-05-15 LAB — CK: Total CK: 50 U/L (ref 7–177)

## 2012-05-15 NOTE — ED Notes (Signed)
Patient stated she was seen about 1 week ago due to an infected lip piercing.  Placed on Clindamycin.  After 2 days of the med she started out with red spots and swelling on her face.  Went back to the MD and started on Bactrim and Prednisone.  Today her face, neck, chest started out with redness and tender to touch.  No difficulty breathing

## 2012-05-15 NOTE — ED Provider Notes (Signed)
History     CSN: [id number]  Arrival date & time 05/15/12  1721   First MD Initiated Contact with Patient 05/15/12 2145      Chief Complaint  Patient presents with  . Body ache    HPI  Provided by the patient. Patient is a 31 year old female with history of anxiety who presents with concerns of body and muscle aches as well as flushing and redness to the skin. Patient reports that she was recently being seen and treated for an infection following a right lower lip piercing. Patient reports having significant swelling and pain to her lip and was initially put on clindamycin. She reports she began to have some improvement of her lip swelling and pains but developed a general "puffy nevus" to her face with swelling patient was evaluated for this and there was possible concerns for a bad reaction to clindamycin and patient was instructed to discontinue this and take Bactrim. The patient was also given a five-day course of prednisone for the concerns of allergic reaction. Patient has been taking this and states that she forgot to take her dose 2 days ago and took both pills yesterday to finish the bottle.  Today pt reports having continued swelling of face and new onset of diffuse redness of skin.  She also complains of neck and back muscle soreness and aches.  She has not taken anything for her symptoms or used any other treatments.  Denies having similar symptoms in the past. She denies having any swelling of the lips tongue or mouth. She denies any difficult breathing or swallowing.    Past Medical History  Diagnosis Date  . Anxiety     Past Surgical History  Procedure Date  . Tonsillectomy     Family History  Problem Relation Age of Onset  . Diabetes Neg Hx   . Heart attack Neg Hx   . Hyperlipidemia Neg Hx   . Hypertension Neg Hx   . Sudden death Neg Hx     History  Substance Use Topics  . Smoking status: Current Some Day Smoker  . Smokeless tobacco: Not on file  . Alcohol  Use: 0.0 oz/week    OB History    Grav Para Term Preterm Abortions TAB SAB Ect Mult Living                  Review of Systems  Constitutional: Negative for fever, chills and appetite change.  HENT: Negative for trouble swallowing and voice change.   Respiratory: Negative for shortness of breath, wheezing and stridor.   Cardiovascular: Negative for chest pain.  Gastrointestinal: Negative for nausea, vomiting and diarrhea.  Musculoskeletal: Positive for myalgias.  Skin: Positive for rash.  Neurological: Negative for dizziness, light-headedness and headaches.    Allergies  Bee venom  Home Medications   Current Outpatient Rx  Name Route Sig Dispense Refill  . CLINDAMYCIN HCL 300 MG PO CAPS Oral Take 1 capsule (300 mg total) by mouth 3 (three) times daily. 21 capsule 0  . SULFAMETHOXAZOLE-TRIMETHOPRIM 800-160 MG PO TABS Oral Take 1 tablet by mouth every 12 (twelve) hours. 20 tablet 0  . VALACYCLOVIR HCL 500 MG PO TABS Oral Take 500 mg by mouth daily.      BP 126/73  Pulse 72  Temp 99.2 F (37.3 C) (Oral)  Resp 16  SpO2 100%  Physical Exam  Nursing note and vitals reviewed. Constitutional: She is oriented to person, place, and time. She appears well-developed and well-nourished. No distress.  HENT:  Head: Normocephalic.  Mouth/Throat: Oropharynx is clear and moist.  Neck: Normal range of motion. Neck supple.  Cardiovascular: Normal rate and regular rhythm.   No murmur heard. Pulmonary/Chest: Effort normal and breath sounds normal. No stridor. No respiratory distress. She has no wheezes. She has no rales.  Abdominal: Soft.  Musculoskeletal: Normal range of motion. She exhibits no edema and no tenderness.  Neurological: She is alert and oriented to person, place, and time. She has normal strength. No sensory deficit.  Skin: Skin is warm and dry. There is erythema.       There is a mild diffuse erythema of the skin that is blanchable. No petechiae, macules or papules  present.  Psychiatric: She has a normal mood and affect. Her behavior is normal.    ED Course  Procedures   Results for orders placed during the hospital encounter of 05/15/12  CBC WITH DIFFERENTIAL      Component Value Range   WBC 9.8  4.0 - 10.5 K/uL   RBC 4.23  3.87 - 5.11 MIL/uL   Hemoglobin 14.1  12.0 - 15.0 g/dL   HCT 21.3  08.6 - 57.8 %   MCV 98.6  78.0 - 100.0 fL   MCH 33.3  26.0 - 34.0 pg   MCHC 33.8  30.0 - 36.0 g/dL   RDW 46.9  62.9 - 52.8 %   Platelets 198  150 - 400 K/uL   Neutrophils Relative 62  43 - 77 %   Neutro Abs 6.1  1.7 - 7.7 K/uL   Lymphocytes Relative 28  12 - 46 %   Lymphs Abs 2.7  0.7 - 4.0 K/uL   Monocytes Relative 9  3 - 12 %   Monocytes Absolute 0.9  0.1 - 1.0 K/uL   Eosinophils Relative 1  0 - 5 %   Eosinophils Absolute 0.1  0.0 - 0.7 K/uL   Basophils Relative 1  0 - 1 %   Basophils Absolute 0.1  0.0 - 0.1 K/uL  BASIC METABOLIC PANEL      Component Value Range   Sodium 140  135 - 145 mEq/L   Potassium 3.9  3.5 - 5.1 mEq/L   Chloride 102  96 - 112 mEq/L   CO2 26  19 - 32 mEq/L   Glucose, Bld 87  70 - 99 mg/dL   BUN 13  6 - 23 mg/dL   Creatinine, Ser 4.13  0.50 - 1.10 mg/dL   Calcium 9.7  8.4 - 24.4 mg/dL   GFR calc non Af Amer >90  >90 mL/min   GFR calc Af Amer >90  >90 mL/min  CK      Component Value Range   Total CK 50  7 - 177 U/L        1. Flushing reaction   2. Myalgia       MDM  Pt seen and evaluated. Patient in no acute distress. Patient is well-appearing and nontoxic. Patient is afebrile.   Pt seen and discussed with attending physician. Labs are unremarkable. Patient is afebrile without complaints of fever at home. Patient has had some of her symptoms prior to taking prednisone and Bactrim. Flushing and myalgias are new after stopping prednisone yesterday. CK level is normal without signs of rhabdomyolysis. At this time patient symptoms are felt related to her medication use she has been instructed to follow up with her  primary care provider.       Angus Seller, [place] 05/16/12 [phone]

## 2012-05-15 NOTE — ED Notes (Signed)
Lip piercing problems put on clindamycin adn then had swelling and put on steroids adn bactrim.  Pt concerned infection is going down neck and has been having increasing pain and swelling. Pt talking in clear voice and no drooling or hoarseness.

## 2012-05-17 ENCOUNTER — Encounter (HOSPITAL_BASED_OUTPATIENT_CLINIC_OR_DEPARTMENT_OTHER): Payer: Self-pay | Admitting: *Deleted

## 2012-05-17 ENCOUNTER — Emergency Department (HOSPITAL_BASED_OUTPATIENT_CLINIC_OR_DEPARTMENT_OTHER)
Admission: EM | Admit: 2012-05-17 | Discharge: 2012-05-17 | Disposition: A | Discharge status: Home / Self Care | Payer: Self-pay | Attending: Emergency Medicine | Admitting: Emergency Medicine

## 2012-05-17 DIAGNOSIS — Z79899 Other long term (current) drug therapy: Secondary | ICD-10-CM | POA: Insufficient documentation

## 2012-05-17 DIAGNOSIS — R780 Finding of alcohol in blood: Secondary | ICD-10-CM | POA: Insufficient documentation

## 2012-05-17 DIAGNOSIS — F411 Generalized anxiety disorder: Secondary | ICD-10-CM | POA: Insufficient documentation

## 2012-05-17 DIAGNOSIS — F17200 Nicotine dependence, unspecified, uncomplicated: Secondary | ICD-10-CM | POA: Insufficient documentation

## 2012-05-17 LAB — ETHANOL: Alcohol, Ethyl (B): 162 mg/dL — ABNORMAL HIGH (ref 0–11)

## 2012-05-17 LAB — RAPID URINE DRUG SCREEN, HOSP PERFORMED
Amphetamines: NOT DETECTED
Benzodiazepines: NOT DETECTED
Cocaine: NOT DETECTED
Opiates: NOT DETECTED

## 2012-05-17 NOTE — ED Notes (Signed)
MD at bedside. 

## 2012-05-17 NOTE — ED Provider Notes (Signed)
I saw and evaluated the patient, reviewed the resident's note and I agree with the findings and plan.   .Face to face Exam:  General:  Awake HEENT:  Atraumatic Resp:  Normal effort Abd:  Nondistended Neuro:No focal weakness Lymph: No adenopathy   Nelia Shi, MD 05/17/12 1756

## 2012-05-17 NOTE — ED Provider Notes (Deleted)
I saw and evaluated the patient, reviewed the resident's note and I agree with the findings and plan.   .Face to face Exam:  General:  Awake HEENT:  Atraumatic Resp:  Normal effort Abd:  Nondistended Neuro:No focal weakness Lymph: No adenopathy   Nelia Shi, MD 05/17/12 1753
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
## 2012-05-18 NOTE — ED Provider Notes (Signed)
Medical screening examination/treatment/procedure(s) were conducted as a shared visit with non-physician practitioner(s) and myself.  I personally evaluated the patient during the encounter  Pt nontoxic, well appearing no need for further emergent workup Stable for d/c BP 126/73  Pulse 72  Temp 99.2 F (37.3 C) (Oral)  Resp 16  SpO2 100%   Joya Gaskins, MD 05/18/12 0009

## 2012-05-18 NOTE — ED Provider Notes (Signed)
Medical screening examination/treatment/procedure(s) were performed by non-physician practitioner and as supervising physician I was immediately available for consultation/collaboration.    Nelia Shi, MD 05/18/12 [phone]

## 2012-05-25 ENCOUNTER — Ambulatory Visit (INDEPENDENT_AMBULATORY_CARE_PROVIDER_SITE_OTHER): Payer: Self-pay | Admitting: Otolaryngology

## 2012-11-05 ENCOUNTER — Emergency Department (HOSPITAL_BASED_OUTPATIENT_CLINIC_OR_DEPARTMENT_OTHER): Payer: Self-pay

## 2012-11-05 ENCOUNTER — Encounter (HOSPITAL_BASED_OUTPATIENT_CLINIC_OR_DEPARTMENT_OTHER): Payer: Self-pay | Admitting: Emergency Medicine

## 2012-11-05 ENCOUNTER — Emergency Department (HOSPITAL_BASED_OUTPATIENT_CLINIC_OR_DEPARTMENT_OTHER)
Admission: EM | Admit: 2012-11-05 | Discharge: 2012-11-05 | Disposition: A | Discharge status: Home / Self Care | Payer: Self-pay | Attending: Emergency Medicine | Admitting: Emergency Medicine

## 2012-11-05 DIAGNOSIS — F17200 Nicotine dependence, unspecified, uncomplicated: Secondary | ICD-10-CM | POA: Insufficient documentation

## 2012-11-05 DIAGNOSIS — Z8659 Personal history of other mental and behavioral disorders: Secondary | ICD-10-CM | POA: Insufficient documentation

## 2012-11-05 DIAGNOSIS — N946 Dysmenorrhea, unspecified: Secondary | ICD-10-CM | POA: Insufficient documentation

## 2012-11-05 DIAGNOSIS — Z3202 Encounter for pregnancy test, result negative: Secondary | ICD-10-CM | POA: Insufficient documentation

## 2012-11-05 DIAGNOSIS — R109 Unspecified abdominal pain: Secondary | ICD-10-CM | POA: Insufficient documentation

## 2012-11-05 LAB — URINALYSIS, ROUTINE W REFLEX MICROSCOPIC
Glucose, UA: NEGATIVE mg/dL
Specific Gravity, Urine: 1.014 (ref 1.005–1.030)
pH: 7.5 (ref 5.0–8.0)

## 2012-11-05 LAB — WET PREP, GENITAL: Yeast Wet Prep HPF POC: NONE SEEN

## 2012-11-05 LAB — URINE MICROSCOPIC-ADD ON

## 2012-11-05 LAB — PREGNANCY, URINE: Preg Test, Ur: NEGATIVE

## 2012-11-05 MED ORDER — IBUPROFEN 800 MG PO TABS: 800.0000 mg | ORAL_TABLET | Freq: Three times a day (TID) | ORAL | Status: DC

## 2012-11-05 MED ORDER — HYDROCODONE-ACETAMINOPHEN 5-325 MG PO TABS: 2.0000 | ORAL_TABLET | ORAL | Status: DC | PRN

## 2012-11-05 MED ORDER — KETOROLAC TROMETHAMINE 60 MG/2ML IM SOLN
60.0000 mg | Freq: Once | INTRAMUSCULAR | Status: AC
  Administered 2012-11-05: 60 mg via INTRAMUSCULAR
  Filled 2012-11-05: qty 2

## 2012-11-05 NOTE — ED Notes (Signed)
Pt states she may be approximately  [redacted] weeks pregnant.   She has been spotting for approximately one week, but started having sharp pelvic pain this am.  She having some discharge when wiping from bathroom (brown, pink) but no large bleeding or clots.  Some nausea.

## 2012-11-05 NOTE — ED Provider Notes (Signed)
History     CSN: [id number]  Arrival date & time 11/05/12  1533   First MD Initiated Contact with Patient 11/05/12 1653      Chief Complaint  Patient presents with  . Threatened Miscarriage    (Consider location/radiation/quality/duration/timing/severity/associated sxs/prior treatment) Patient is a 32 y.o. female presenting with vaginal bleeding. The history is provided by the patient. No language interpreter was used.  Vaginal Bleeding This is a new problem. The current episode started today. The problem occurs constantly. The problem has been gradually worsening. Associated symptoms include abdominal pain. Nothing aggravates the symptoms. She has tried nothing for the symptoms. The treatment provided no relief.  Pt reports she had a positive pregnancy test 3 weeks ago.   Pt reports she began spotting this am,  Now passing clots  Past Medical History  Diagnosis Date  . Anxiety     Past Surgical History  Procedure Laterality Date  . Tonsillectomy      Family History  Problem Relation Age of Onset  . Diabetes Neg Hx   . Heart attack Neg Hx   . Hyperlipidemia Neg Hx   . Hypertension Neg Hx   . Sudden death Neg Hx     History  Substance Use Topics  . Smoking status: Current Some Day Smoker  . Smokeless tobacco: Not on file  . Alcohol Use: 0.0 oz/week    OB History   Grav Para Term Preterm Abortions TAB SAB Ect Mult Living   6 2 2  2            Review of Systems  Gastrointestinal: Positive for abdominal pain.  Genitourinary: Positive for vaginal bleeding.  All other systems reviewed and are negative.    Allergies  Bee venom  Home Medications   Current Outpatient Rx  Name  Route  Sig  Dispense  Refill  . Multiple Vitamin (MULTIVITAMIN) capsule   Oral   Take 1 capsule by mouth daily.           BP 119/74  Pulse 94  Resp 20  Ht 5\' 4"  (1.626 m)  Wt 127 lb (57.607 kg)  BMI 21.79 kg/m2  SpO2 98%  LMP 08/28/2012  Breastfeeding? Unknown  Physical  Exam  Vitals reviewed. Constitutional: She is oriented to person, place, and time. She appears well-developed and well-nourished.  HENT:  Head: Normocephalic and atraumatic.  Eyes: Conjunctivae and EOM are normal. Pupils are equal, round, and reactive to light.  Neck: Normal range of motion. Neck supple.  Cardiovascular: Normal rate, regular rhythm and normal heart sounds.   Pulmonary/Chest: Effort normal and breath sounds normal.  Abdominal: Soft. She exhibits no distension.  Genitourinary: Uterus normal.  Moderate blood, dark,   No hemmorhage ,no clots  Musculoskeletal: Normal range of motion.  Neurological: She is alert and oriented to person, place, and time.  Skin: Skin is warm.  Psychiatric: She has a normal mood and affect.    ED Course  Procedures (including critical care time)  Labs Reviewed  URINALYSIS, ROUTINE W REFLEX MICROSCOPIC - Abnormal; Notable for the following:    APPearance CLOUDY (*)    Hgb urine dipstick LARGE (*)    Leukocytes, UA SMALL (*)    All other components within normal limits  WET PREP, GENITAL  GC/CHLAMYDIA PROBE AMP  PREGNANCY, URINE  URINE MICROSCOPIC-ADD ON   No results found.   No diagnosis found.    MDM  Pregnancy negative,  Ultrasound normal.   Pt given a shot of torodol.  Pt given rx for ibuprofen and hydrocodone.   Pt advised follow up with her gyn for evaluation if symptoms persist       Elson Areas, [place] 11/05/12 1754

## 2012-11-07 NOTE — ED Provider Notes (Signed)
Medical screening examination/treatment/procedure(s) were performed by non-physician practitioner and as supervising physician I was immediately available for consultation/collaboration.  Allanna Bresee J. Michelle Vanhise, MD 11/07/12 0843 

## 2013-01-01 ENCOUNTER — Emergency Department (HOSPITAL_BASED_OUTPATIENT_CLINIC_OR_DEPARTMENT_OTHER)
Admission: EM | Admit: 2013-01-01 | Discharge: 2013-01-01 | Disposition: A | Discharge status: Home / Self Care | Payer: Self-pay | Attending: Emergency Medicine | Admitting: Emergency Medicine

## 2013-01-01 ENCOUNTER — Encounter (HOSPITAL_BASED_OUTPATIENT_CLINIC_OR_DEPARTMENT_OTHER): Payer: Self-pay | Admitting: *Deleted

## 2013-01-01 DIAGNOSIS — Z711 Person with feared health complaint in whom no diagnosis is made: Secondary | ICD-10-CM | POA: Insufficient documentation

## 2013-01-01 DIAGNOSIS — Z8619 Personal history of other infectious and parasitic diseases: Secondary | ICD-10-CM | POA: Insufficient documentation

## 2013-01-01 DIAGNOSIS — Z8659 Personal history of other mental and behavioral disorders: Secondary | ICD-10-CM | POA: Insufficient documentation

## 2013-01-01 DIAGNOSIS — R109 Unspecified abdominal pain: Secondary | ICD-10-CM | POA: Insufficient documentation

## 2013-01-01 DIAGNOSIS — Z3202 Encounter for pregnancy test, result negative: Secondary | ICD-10-CM | POA: Insufficient documentation

## 2013-01-01 DIAGNOSIS — N760 Acute vaginitis: Secondary | ICD-10-CM | POA: Insufficient documentation

## 2013-01-01 DIAGNOSIS — F17200 Nicotine dependence, unspecified, uncomplicated: Secondary | ICD-10-CM | POA: Insufficient documentation

## 2013-01-01 LAB — URINALYSIS, ROUTINE W REFLEX MICROSCOPIC
Glucose, UA: NEGATIVE mg/dL
Specific Gravity, Urine: 1.015 (ref 1.005–1.030)
Urobilinogen, UA: 0.2 mg/dL (ref 0.0–1.0)

## 2013-01-01 LAB — WET PREP, GENITAL: Yeast Wet Prep HPF POC: NONE SEEN

## 2013-01-01 LAB — URINE MICROSCOPIC-ADD ON

## 2013-01-01 LAB — PREGNANCY, URINE: Preg Test, Ur: NEGATIVE

## 2013-01-01 NOTE — ED Provider Notes (Signed)
History     CSN: [id number]  Arrival date & time 01/01/13  1233   First MD Initiated Contact with Patient 01/01/13 1308      Chief Complaint  Patient presents with  . Vaginal Itching    (Consider location/radiation/quality/duration/timing/severity/associated sxs/prior treatment) HPI Comments: Patient presents with complaints of vaginal itching, irritation that started two days ago after she started her period.  She has had a yeast infection in the past and this feels similar.  No relief with monistat.  No abd pain.  She is currently on her menses.  She is sexually active with one partner and uses condoms.  This relationship is somewhat new but there has been no unprotected sex.  Patient is a 32 y.o. female presenting with vaginal itching. The history is provided by the patient.  Vaginal Itching This is a new problem. The current episode started 2 days ago. The problem occurs constantly. The problem has been gradually worsening. Associated symptoms include abdominal pain. Nothing aggravates the symptoms. Nothing relieves the symptoms. She has tried nothing for the symptoms.    Past Medical History  Diagnosis Date  . Anxiety     Past Surgical History  Procedure Laterality Date  . Tonsillectomy      Family History  Problem Relation Age of Onset  . Diabetes Neg Hx   . Heart attack Neg Hx   . Hyperlipidemia Neg Hx   . Hypertension Neg Hx   . Sudden death Neg Hx     History  Substance Use Topics  . Smoking status: Current Some Day Smoker  . Smokeless tobacco: Not on file  . Alcohol Use: 0.0 oz/week     Comment: occassionally    OB History   Grav Para Term Preterm Abortions TAB SAB Ect Mult Living   6 2 2  2            Review of Systems  Gastrointestinal: Positive for abdominal pain.  All other systems reviewed and are negative.    Allergies  Bee venom  Home Medications   Current Outpatient Rx  Name  Route  Sig  Dispense  Refill  . HYDROcodone-acetaminophen  (NORCO/VICODIN) 5-325 MG per tablet   Oral   Take 2 tablets by mouth every 4 (four) hours as needed for pain.   10 tablet   0   . ibuprofen (ADVIL,MOTRIN) 800 MG tablet   Oral   Take 1 tablet (800 mg total) by mouth 3 (three) times daily.   21 tablet   0   . Multiple Vitamin (MULTIVITAMIN) capsule   Oral   Take 1 capsule by mouth daily.           BP 117/81  Pulse 75  Temp(Src) 99.3 F (37.4 C) (Oral)  Resp 16  SpO2 99%  LMP 12/30/2012  Physical Exam  Nursing note and vitals reviewed. Constitutional: She is oriented to person, place, and time. She appears well-developed and well-nourished. No distress.  HENT:  Head: Normocephalic and atraumatic.  Neck: Normal range of motion. Neck supple.  Cardiovascular: Normal rate and regular rhythm.  Exam reveals no gallop and no friction rub.   No murmur heard. Pulmonary/Chest: Effort normal and breath sounds normal. No respiratory distress. She has no wheezes.  Abdominal: Soft. Bowel sounds are normal. She exhibits no distension. There is no tenderness.  Genitourinary: Vagina normal and uterus normal. No vaginal discharge found.  There is menstrual blood in the vagina.  There is mild inflammation to the vaginal mucosa but  no lesions or discharge.  Musculoskeletal: Normal range of motion.  Neurological: She is alert and oriented to person, place, and time.  Skin: Skin is warm and dry. She is not diaphoretic.    ED Course  Procedures (including critical care time)  Labs Reviewed  URINALYSIS, ROUTINE W REFLEX MICROSCOPIC  PREGNANCY, URINE   No results found.   No diagnosis found.    MDM  I doubt infection as the wet prep was clear and no wbcs.  Suspect inflammation from new brand of condoms.          Geoffery Lyons, MD 01/01/13 1416

## 2013-01-01 NOTE — ED Notes (Signed)
Patient states she developed vaginal burning 2 days ago after starting her period.  States she thinks she has a yeast infection.  Denies discharge.  Used monostat cream last night.  States she was sore when she inserted her tampon.

## 2013-01-02 LAB — URINE CULTURE: Culture: NO GROWTH

## 2013-01-10 ENCOUNTER — Emergency Department (HOSPITAL_COMMUNITY): Payer: Self-pay

## 2013-01-10 ENCOUNTER — Emergency Department (HOSPITAL_COMMUNITY)
Admission: EM | Admit: 2013-01-10 | Discharge: 2013-01-10 | Disposition: A | Discharge status: Home / Self Care | Payer: Self-pay | Attending: Emergency Medicine | Admitting: Emergency Medicine

## 2013-01-10 DIAGNOSIS — Y939 Activity, unspecified: Secondary | ICD-10-CM | POA: Insufficient documentation

## 2013-01-10 DIAGNOSIS — Z8659 Personal history of other mental and behavioral disorders: Secondary | ICD-10-CM | POA: Insufficient documentation

## 2013-01-10 DIAGNOSIS — W1789XA Other fall from one level to another, initial encounter: Secondary | ICD-10-CM | POA: Insufficient documentation

## 2013-01-10 DIAGNOSIS — S20212A Contusion of left front wall of thorax, initial encounter: Secondary | ICD-10-CM

## 2013-01-10 DIAGNOSIS — F17200 Nicotine dependence, unspecified, uncomplicated: Secondary | ICD-10-CM | POA: Insufficient documentation

## 2013-01-10 DIAGNOSIS — Y929 Unspecified place or not applicable: Secondary | ICD-10-CM | POA: Insufficient documentation

## 2013-01-10 DIAGNOSIS — S20219A Contusion of unspecified front wall of thorax, initial encounter: Secondary | ICD-10-CM | POA: Insufficient documentation

## 2013-01-10 MED ORDER — OXYCODONE-ACETAMINOPHEN 5-325 MG PO TABS: ORAL_TABLET | ORAL | Status: DC

## 2013-01-10 MED ORDER — OXYCODONE-ACETAMINOPHEN 5-325 MG PO TABS
1.0000 | ORAL_TABLET | Freq: Once | ORAL | Status: AC
  Administered 2013-01-10: 1 via ORAL
  Filled 2013-01-10: qty 1
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
## 2013-01-11 NOTE — ED Provider Notes (Signed)
Medical screening examination/treatment/procedure(s) were performed by non-physician practitioner and as supervising physician I was immediately available for consultation/collaboration.   Amori Cooperman E Roswell Ndiaye, MD 01/11/13 0729 

## 2013-01-21 ENCOUNTER — Emergency Department (HOSPITAL_COMMUNITY)
Admission: EM | Admit: 2013-01-21 | Discharge: 2013-01-21 | Disposition: A | Discharge status: Home / Self Care | Payer: Self-pay | Attending: Emergency Medicine | Admitting: Emergency Medicine

## 2013-01-21 ENCOUNTER — Encounter (HOSPITAL_COMMUNITY): Payer: Self-pay | Admitting: *Deleted

## 2013-01-21 ENCOUNTER — Emergency Department (HOSPITAL_COMMUNITY): Payer: Self-pay

## 2013-01-21 DIAGNOSIS — R296 Repeated falls: Secondary | ICD-10-CM | POA: Insufficient documentation

## 2013-01-21 DIAGNOSIS — Y929 Unspecified place or not applicable: Secondary | ICD-10-CM | POA: Insufficient documentation

## 2013-01-21 DIAGNOSIS — T07XXXA Unspecified multiple injuries, initial encounter: Secondary | ICD-10-CM

## 2013-01-21 DIAGNOSIS — F1410 Cocaine abuse, uncomplicated: Secondary | ICD-10-CM | POA: Insufficient documentation

## 2013-01-21 DIAGNOSIS — F1110 Opioid abuse, uncomplicated: Secondary | ICD-10-CM | POA: Insufficient documentation

## 2013-01-21 DIAGNOSIS — R42 Dizziness and giddiness: Secondary | ICD-10-CM | POA: Insufficient documentation

## 2013-01-21 DIAGNOSIS — IMO0002 Reserved for concepts with insufficient information to code with codable children: Secondary | ICD-10-CM | POA: Insufficient documentation

## 2013-01-21 DIAGNOSIS — F17200 Nicotine dependence, unspecified, uncomplicated: Secondary | ICD-10-CM | POA: Insufficient documentation

## 2013-01-21 DIAGNOSIS — F1310 Sedative, hypnotic or anxiolytic abuse, uncomplicated: Secondary | ICD-10-CM | POA: Insufficient documentation

## 2013-01-21 DIAGNOSIS — F1010 Alcohol abuse, uncomplicated: Secondary | ICD-10-CM | POA: Insufficient documentation

## 2013-01-21 DIAGNOSIS — Y998 Other external cause status: Secondary | ICD-10-CM | POA: Insufficient documentation

## 2013-01-21 DIAGNOSIS — F411 Generalized anxiety disorder: Secondary | ICD-10-CM | POA: Insufficient documentation

## 2013-01-21 LAB — URINALYSIS, ROUTINE W REFLEX MICROSCOPIC
Bilirubin Urine: NEGATIVE
Glucose, UA: NEGATIVE mg/dL
Specific Gravity, Urine: 1.016 (ref 1.005–1.030)
Urobilinogen, UA: 0.2 mg/dL (ref 0.0–1.0)
pH: 5.5 (ref 5.0–8.0)

## 2013-01-21 LAB — CBC WITH DIFFERENTIAL/PLATELET
Basophils Relative: 0 % (ref 0–1)
Eosinophils Absolute: 0 10*3/uL (ref 0.0–0.7)
HCT: 39.4 % (ref 36.0–46.0)
Hemoglobin: 13.6 g/dL (ref 12.0–15.0)
MCH: 33.9 pg (ref 26.0–34.0)
MCHC: 34.5 g/dL (ref 30.0–36.0)
Monocytes Absolute: 0.5 10*3/uL (ref 0.1–1.0)
Monocytes Relative: 6 % (ref 3–12)

## 2013-01-21 LAB — COMPREHENSIVE METABOLIC PANEL
Albumin: 4 g/dL (ref 3.5–5.2)
BUN: 7 mg/dL (ref 6–23)
Chloride: 102 mEq/L (ref 96–112)
Creatinine, Ser: 0.75 mg/dL (ref 0.50–1.10)
GFR calc Af Amer: >90 mL/min (ref >90)
Glucose, Bld: 82 mg/dL (ref 70–99)
Total Bilirubin: 0.4 mg/dL (ref 0.3–1.2)
Total Protein: 6.7 g/dL (ref 6.0–8.3)

## 2013-01-21 LAB — ETHANOL: Alcohol, Ethyl (B): 225 mg/dL — ABNORMAL HIGH (ref 0–11)

## 2013-01-21 LAB — URINE MICROSCOPIC-ADD ON

## 2013-01-21 LAB — RAPID URINE DRUG SCREEN, HOSP PERFORMED
Benzodiazepines: POSITIVE — AB
Cocaine: POSITIVE — AB

## 2013-01-21 LAB — SALICYLATE LEVEL: Salicylate Lvl: <2 mg/dL — ABNORMAL LOW (ref 2.8–20.0)

## 2013-01-21 MED ORDER — LORAZEPAM 1 MG PO TABS
1.0000 mg | ORAL_TABLET | Freq: Once | ORAL | Status: AC
  Administered 2013-01-21: 1 mg via ORAL
  Filled 2013-01-21: qty 1

## 2013-01-21 MED ORDER — ONDANSETRON 4 MG PO TBDP
4.0000 mg | ORAL_TABLET | Freq: Once | ORAL | Status: AC
Start: 2013-01-21 — End: 2013-01-21
  Administered 2013-01-21: 4 mg via ORAL
  Filled 2013-01-21: qty 1

## 2013-01-21 MED ORDER — SODIUM CHLORIDE 0.9 % IV BOLUS (SEPSIS)
1000.0000 mL | Freq: Once | INTRAVENOUS | Status: AC
  Administered 2013-01-21: 1000 mL via INTRAVENOUS

## 2013-01-21 MED ORDER — ONDANSETRON HCL 4 MG/2ML IJ SOLN
4.0000 mg | Freq: Once | INTRAMUSCULAR | Status: AC
  Administered 2013-01-21: 4 mg via INTRAVENOUS
  Filled 2013-01-21: qty 2

## 2013-01-21 NOTE — ED Notes (Signed)
Discharge instructions reviewed. Pt verbalized understanding.  
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
## 2013-01-22 LAB — URINE CULTURE

## 2013-01-26 NOTE — ED Provider Notes (Signed)
Medical screening examination/treatment/procedure(s) were performed by non-physician practitioner and as supervising physician I was immediately available for consultation/collaboration.   Richardean Canal, MD 01/26/13 [phone]

## 2013-03-22 ENCOUNTER — Encounter (HOSPITAL_COMMUNITY): Payer: Self-pay

## 2013-03-22 ENCOUNTER — Emergency Department (HOSPITAL_COMMUNITY): Payer: Self-pay

## 2013-03-22 ENCOUNTER — Emergency Department (HOSPITAL_COMMUNITY)
Admission: EM | Admit: 2013-03-22 | Discharge: 2013-03-22 | Disposition: A | Discharge status: Home / Self Care | Payer: Self-pay | Attending: Emergency Medicine | Admitting: Emergency Medicine

## 2013-03-22 DIAGNOSIS — W010XXA Fall on same level from slipping, tripping and stumbling without subsequent striking against object, initial encounter: Secondary | ICD-10-CM | POA: Insufficient documentation

## 2013-03-22 DIAGNOSIS — Y929 Unspecified place or not applicable: Secondary | ICD-10-CM | POA: Insufficient documentation

## 2013-03-22 DIAGNOSIS — F17200 Nicotine dependence, unspecified, uncomplicated: Secondary | ICD-10-CM | POA: Insufficient documentation

## 2013-03-22 DIAGNOSIS — Z8659 Personal history of other mental and behavioral disorders: Secondary | ICD-10-CM | POA: Insufficient documentation

## 2013-03-22 DIAGNOSIS — X500XXA Overexertion from strenuous movement or load, initial encounter: Secondary | ICD-10-CM | POA: Insufficient documentation

## 2013-03-22 DIAGNOSIS — S199XXA Unspecified injury of neck, initial encounter: Secondary | ICD-10-CM | POA: Insufficient documentation

## 2013-03-22 DIAGNOSIS — S0993XA Unspecified injury of face, initial encounter: Secondary | ICD-10-CM | POA: Insufficient documentation

## 2013-03-22 DIAGNOSIS — F1010 Alcohol abuse, uncomplicated: Secondary | ICD-10-CM | POA: Insufficient documentation

## 2013-03-22 DIAGNOSIS — W19XXXA Unspecified fall, initial encounter: Secondary | ICD-10-CM

## 2013-03-22 DIAGNOSIS — S0990XA Unspecified injury of head, initial encounter: Secondary | ICD-10-CM | POA: Insufficient documentation

## 2013-03-22 DIAGNOSIS — Y9389 Activity, other specified: Secondary | ICD-10-CM | POA: Insufficient documentation

## 2013-03-22 DIAGNOSIS — W1809XA Striking against other object with subsequent fall, initial encounter: Secondary | ICD-10-CM | POA: Insufficient documentation

## 2013-03-22 DIAGNOSIS — F10929 Alcohol use, unspecified with intoxication, unspecified: Secondary | ICD-10-CM

## 2013-03-22 MED ORDER — TRAMADOL HCL 50 MG PO TABS
50.0000 mg | ORAL_TABLET | Freq: Once | ORAL | Status: AC
  Administered 2013-03-22: 50 mg via ORAL
  Filled 2013-03-22: qty 1

## 2013-03-22 MED ORDER — TRAMADOL HCL 50 MG PO TABS: 50.0000 mg | ORAL_TABLET | Freq: Four times a day (QID) | ORAL | Status: DC | PRN

## 2013-03-22 NOTE — ED Provider Notes (Signed)
Medical screening examination/treatment/procedure(s) were performed by non-physician practitioner and as supervising physician I was immediately available for consultation/collaboration.  Olivia Mackie, MD 03/22/13 [phone]
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
## 2013-03-22 NOTE — ED Provider Notes (Signed)
Medical screening examination/treatment/procedure(s) were performed by non-physician practitioner and as supervising physician I was immediately available for consultation/collaboration.  Olivia Mackie, MD 03/22/13 [phone]

## 2013-03-22 NOTE — ED Provider Notes (Signed)
Physical Exam  BP 103/75  Pulse 76  Temp(Src) 98 F (36.7 C) (Oral)  Resp 18  Ht 5\' 4"  (1.626 m)  Wt 128 lb (58.06 kg)  BMI 21.96 kg/m2  SpO2 100%  LMP 03/04/2013  Physical Exam  Nursing note and vitals reviewed. Constitutional: She is oriented to person, place, and time. She appears well-developed and well-nourished. She appears distressed.  Patient cries on exam  HENT:  Head: Normocephalic and atraumatic.  Right Ear: External ear normal.  Left Ear: External ear normal.  Nose: Nose normal.  Mouth/Throat: Oropharynx is clear and moist.  Atraumatic, no laceration, abrasion, hematoma  Eyes: Conjunctivae and EOM are normal. Pupils are equal, round, and reactive to light.  Neck: Normal range of motion.  Cardiovascular: Normal rate, regular rhythm and normal heart sounds.   Pulmonary/Chest: Effort normal and breath sounds normal. No stridor. No respiratory distress. She has no wheezes. She has no rales.  Abdominal: Soft. She exhibits no distension. There is no tenderness.  Musculoskeletal: Normal range of motion.       Back:  Neurological: She is alert and oriented to person, place, and time. She has normal strength.  Skin: Skin is warm and dry. She is not diaphoretic. No erythema.  Psychiatric: She has a normal mood and affect. Her behavior is normal.    ED Course  Procedures  Dg Thoracic Spine 2 View  03/22/2013   *RADIOLOGY REPORT*  Clinical Data: History of fall complaining of back pain.  THORACIC SPINE - 2 VIEW  Comparison: No priors.  Findings: Two views of the thoracic spine demonstrate no acute displaced fractures or definite compression type fractures. Alignment is anatomic.  Visualized portions of the bony thorax are grossly intact.  IMPRESSION: 1.  No acute radiographic abnormality of the thoracic spine.   Original Report Authenticated By: Trudie Reed, M.D.   Dg Lumbar Spine Complete  03/22/2013   *RADIOLOGY REPORT*  Clinical Data: History of fall complaining of  back pain.  LUMBAR SPINE - COMPLETE 4+ VIEW  Comparison: No priors.  Findings: Five views of the lumbar spine demonstrate no definite acute displaced fractures or compression type fractures.  Alignment is anatomic.  No defects of the pars interarticularis are noted.  IMPRESSION: No radiographic evidence of significant acute traumatic injury to the lumbar spine.   Original Report Authenticated By: Trudie Reed, M.D.   Ct Head Wo Contrast  03/22/2013   *RADIOLOGY REPORT*  Clinical Data:  History of trauma from a fall.  CT HEAD WITHOUT CONTRAST CT CERVICAL SPINE WITHOUT CONTRAST  Technique:  Multidetector CT imaging of the head and cervical spine was performed following the standard protocol without intravenous contrast.  Multiplanar CT image reconstructions of the cervical spine were also generated.  Comparison:  Head CT 01/07/2011.  CT HEAD  Findings: No acute displaced skull fractures are identified.  No acute intracranial abnormality.  Specifically, no evidence of acute post-traumatic intracranial hemorrhage, no definite regions of acute/subacute cerebral ischemia, no focal mass, mass effect, hydrocephalus or abnormal intra or extra-axial fluid collections. The visualized paranasal sinuses and mastoids are well pneumatized, with the exception of a small polypoid lesion in the left sphenoid sinus which may represent a mucosal retention cyst or polyp.  Marland Kitchen  IMPRESSION: 1.  No acute displaced skull fractures or acute intracranial abnormalities. 2.  The appearance of the brain is normal. 3.  Small mucosal retention cyst or polyp in the left sphenoid sinus incidentally noted.  CT CERVICAL SPINE  Findings: No acute  displaced fractures of the cervical spine. Alignment is anatomic.  Prevertebral soft tissues are normal. Visualized portions of the upper thorax are unremarkable.  IMPRESSION: 1.  No evidence of significant acute traumatic injury to the cervical spine.   Original Report Authenticated By: Trudie Reed,  M.D.   Ct Cervical Spine Wo Contrast  03/22/2013   *RADIOLOGY REPORT*  Clinical Data:  History of trauma from a fall.  CT HEAD WITHOUT CONTRAST CT CERVICAL SPINE WITHOUT CONTRAST  Technique:  Multidetector CT imaging of the head and cervical spine was performed following the standard protocol without intravenous contrast.  Multiplanar CT image reconstructions of the cervical spine were also generated.  Comparison:  Head CT 01/07/2011.  CT HEAD  Findings: No acute displaced skull fractures are identified.  No acute intracranial abnormality.  Specifically, no evidence of acute post-traumatic intracranial hemorrhage, no definite regions of acute/subacute cerebral ischemia, no focal mass, mass effect, hydrocephalus or abnormal intra or extra-axial fluid collections. The visualized paranasal sinuses and mastoids are well pneumatized, with the exception of a small polypoid lesion in the left sphenoid sinus which may represent a mucosal retention cyst or polyp.  Marland Kitchen  IMPRESSION: 1.  No acute displaced skull fractures or acute intracranial abnormalities. 2.  The appearance of the brain is normal. 3.  Small mucosal retention cyst or polyp in the left sphenoid sinus incidentally noted.  CT CERVICAL SPINE  Findings: No acute displaced fractures of the cervical spine. Alignment is anatomic.  Prevertebral soft tissues are normal. Visualized portions of the upper thorax are unremarkable.  IMPRESSION: 1.  No evidence of significant acute traumatic injury to the cervical spine.   Original Report Authenticated By: Trudie Reed, M.D.     MDM The patient signed out to me by Dammen, PA-C at shift change. All imaging negative for acute pathology. Plan to discharge patient home. Patient began complaining of feeling as though her head to split open. I thoroughly examined the patient's scalp which has no laceration or any source of bleeding. Patient was given reassurance and discussed concussion signs and symptoms. Return  instructions given. Vital signs stable for discharge.     Mora Bellman, PA-C 03/22/13 [phone]

## 2013-05-07 ENCOUNTER — Emergency Department (HOSPITAL_BASED_OUTPATIENT_CLINIC_OR_DEPARTMENT_OTHER)
Admission: EM | Admit: 2013-05-07 | Discharge: 2013-05-07 | Disposition: A | Discharge status: Home / Self Care | Payer: Self-pay | Attending: Emergency Medicine | Admitting: Emergency Medicine

## 2013-05-07 ENCOUNTER — Encounter (HOSPITAL_BASED_OUTPATIENT_CLINIC_OR_DEPARTMENT_OTHER): Payer: Self-pay | Admitting: Emergency Medicine

## 2013-05-07 DIAGNOSIS — N949 Unspecified condition associated with female genital organs and menstrual cycle: Secondary | ICD-10-CM | POA: Insufficient documentation

## 2013-05-07 DIAGNOSIS — F17200 Nicotine dependence, unspecified, uncomplicated: Secondary | ICD-10-CM | POA: Insufficient documentation

## 2013-05-07 DIAGNOSIS — R112 Nausea with vomiting, unspecified: Secondary | ICD-10-CM | POA: Insufficient documentation

## 2013-05-07 DIAGNOSIS — Z3202 Encounter for pregnancy test, result negative: Secondary | ICD-10-CM | POA: Insufficient documentation

## 2013-05-07 DIAGNOSIS — F411 Generalized anxiety disorder: Secondary | ICD-10-CM | POA: Insufficient documentation

## 2013-05-07 DIAGNOSIS — N9489 Other specified conditions associated with female genital organs and menstrual cycle: Secondary | ICD-10-CM

## 2013-05-07 DIAGNOSIS — Z79899 Other long term (current) drug therapy: Secondary | ICD-10-CM | POA: Insufficient documentation

## 2013-05-07 LAB — URINALYSIS, ROUTINE W REFLEX MICROSCOPIC
Ketones, ur: NEGATIVE mg/dL
Leukocytes, UA: NEGATIVE
Nitrite: NEGATIVE
Protein, ur: NEGATIVE mg/dL
Urobilinogen, UA: 0.2 mg/dL (ref 0.0–1.0)
pH: 6.5 (ref 5.0–8.0)

## 2013-05-07 MED ORDER — HYDROCODONE-ACETAMINOPHEN 5-325 MG PO TABS
1.0000 | ORAL_TABLET | Freq: Once | ORAL | Status: AC
  Administered 2013-05-07: 1 via ORAL
  Filled 2013-05-07: qty 1

## 2013-05-07 MED ORDER — IBUPROFEN 800 MG PO TABS
800.0000 mg | ORAL_TABLET | Freq: Once | ORAL | Status: AC
  Administered 2013-05-07: 800 mg via ORAL
  Filled 2013-05-07: qty 1

## 2013-05-07 NOTE — ED Notes (Signed)
Pt reports to ed for abdominal pain that started today, pt reports taking 2 pregnancy test that both were positive. Pt states she had some spotting last month but no full period in a while. Pt denies any vaginal discharge, bleeding, or trouble urinating. Pt also reports nausea and vomiting. Pt is alert and oriented, nad noted at this time

## 2013-05-07 NOTE — ED Provider Notes (Signed)
CSN: [id number]     Arrival date & time 05/07/13  0119 History   First MD Initiated Contact with Patient 05/07/13 0204     Chief Complaint  Patient presents with  . Abdominal Pain   (Consider location/radiation/quality/duration/timing/severity/associated sxs/prior Treatment) HPI This is a 32 year old female whose last normal menstrual period was in July although she did have some spotting about August 18. She believes she is pregnant, having had 2 positive pregnancy tests. She is here for been awakened from sleep this morning by mild to moderate pelvic cramping. She has not had any associated vaginal bleeding, vaginal discharge, dysuria or hematuria. She has had nausea and vomiting. Her symptoms are slightly worse with movement or palpation.  Past Medical History  Diagnosis Date  . Anxiety    Past Surgical History  Procedure Laterality Date  . Tonsillectomy     Family History  Problem Relation Age of Onset  . Diabetes Neg Hx   . Heart attack Neg Hx   . Hyperlipidemia Neg Hx   . Hypertension Neg Hx   . Sudden death Neg Hx    History  Substance Use Topics  . Smoking status: Current Some Day Smoker  . Smokeless tobacco: Not on file  . Alcohol Use: 0.0 oz/week     Comment: occassionally   OB History   Grav Para Term Preterm Abortions TAB SAB Ect Mult Living   6 2 2  2           Review of Systems  All other systems reviewed and are negative.    Allergies  Bee venom  Home Medications   Current Outpatient Rx  Name  Route  Sig  Dispense  Refill  . ibuprofen (ADVIL,MOTRIN) 200 MG tablet   Oral   Take 400 mg by mouth every 6 (six) hours as needed for pain.         . Multiple Vitamin (MULTIVITAMIN) capsule   Oral   Take 1 capsule by mouth daily.         . traMADol (ULTRAM) 50 MG tablet   Oral   Take 1 tablet (50 mg total) by mouth every 6 (six) hours as needed for pain.   15 tablet   0    BP 113/77  Pulse 100  Temp(Src) 98.6 F (37 C) (Oral)  Resp 18  Ht  5\' 4"  (1.626 m)  Wt 122 lb (55.339 kg)  BMI 20.93 kg/m2  SpO2 100%  LMP 03/26/2013  Physical Exam General: Well-developed, well-nourished female in no acute distress; appearance consistent with age of record HENT: normocephalic; atraumatic Eyes: pupils equal, round and reactive to light; extraocular muscles intact Neck: supple Heart: regular rate and rhythm Lungs: clear to auscultation bilaterally Abdomen: soft; nondistended; mild suprapubic tenderness; no masses or hepatosplenomegaly; bowel sounds present GU: Normal external genitalia; no vaginal bleeding; no vaginal discharge; cervical os closed; no cervical motion tenderness; Extremities: No deformity; full range of motion Neurologic: Awake, alert and oriented; motor function intact in all extremities and symmetric; no facial droop Skin: Warm and dry Psychiatric: Flat affect    ED Course  Procedures (including critical care time)     MDM   Nursing notes and vitals signs, including pulse oximetry, reviewed.  Summary of this visit's results, reviewed by myself:  Labs:  Results for orders placed during the hospital encounter of 05/07/13 (from the past 24 hour(s))  HCG, QUANTITATIVE, PREGNANCY     Status: None   Collection Time    05/07/13  2:41  AM      Result Value Range   hCG, Beta Chain, Quant, S <1  <5 mIU/mL  URINALYSIS, ROUTINE W REFLEX MICROSCOPIC     Status: None   Collection Time    05/07/13  3:09 AM      Result Value Range   Color, Urine YELLOW  YELLOW   APPearance CLEAR  CLEAR   Specific Gravity, Urine 1.010  1.005 - 1.030   pH 6.5  5.0 - 8.0   Glucose, UA NEGATIVE  NEGATIVE mg/dL   Hgb urine dipstick NEGATIVE  NEGATIVE   Bilirubin Urine NEGATIVE  NEGATIVE   Ketones, ur NEGATIVE  NEGATIVE mg/dL   Protein, ur NEGATIVE  NEGATIVE mg/dL   Urobilinogen, UA 0.2  0.0 - 1.0 mg/dL   Nitrite NEGATIVE  NEGATIVE   Leukocytes, UA NEGATIVE  NEGATIVE  PREGNANCY, URINE     Status: None   Collection Time     05/07/13  3:09 AM      Result Value Range   Preg Test, Ur NEGATIVE  NEGATIVE        Hanley Seamen, MD 05/07/13 [phone]

## 2013-07-18 ENCOUNTER — Emergency Department (HOSPITAL_COMMUNITY)
Admission: EM | Admit: 2013-07-18 | Discharge: 2013-07-18 | Disposition: A | Discharge status: Home / Self Care | Payer: Self-pay | Attending: Emergency Medicine | Admitting: Emergency Medicine

## 2013-07-18 ENCOUNTER — Encounter (HOSPITAL_COMMUNITY): Payer: Self-pay | Admitting: Emergency Medicine

## 2013-07-18 DIAGNOSIS — M25561 Pain in right knee: Secondary | ICD-10-CM

## 2013-07-18 DIAGNOSIS — F17200 Nicotine dependence, unspecified, uncomplicated: Secondary | ICD-10-CM | POA: Insufficient documentation

## 2013-07-18 DIAGNOSIS — M25569 Pain in unspecified knee: Secondary | ICD-10-CM | POA: Insufficient documentation

## 2013-07-18 MED ORDER — TRAMADOL HCL 50 MG PO TABS
50.0000 mg | ORAL_TABLET | Freq: Once | ORAL | Status: AC
  Administered 2013-07-18: 50 mg via ORAL
  Filled 2013-07-18: qty 1

## 2013-07-18 MED ORDER — TRAMADOL HCL 50 MG PO TABS: 50.0000 mg | ORAL_TABLET | Freq: Four times a day (QID) | ORAL | Status: DC | PRN

## 2013-07-18 NOTE — ED Provider Notes (Signed)
CSN: [id number]     Arrival date & time 07/18/13  1018 History   First MD Initiated Contact with Kirsten [name] 07/18/13 1020    Kirsten chart was scribed for Junious Silk PA-C, a non-physician practitioner working with Toy Baker, MD by Lewanda Rife, ED Scribe. Kirsten [name] was seen in room TR07C/TR07C and the Kirsten [name]'s care was started at 11:04 AM     No chief complaint on file.  (Consider location/radiation/quality/duration/timing/severity/associated sxs/prior Treatment) The history is provided by the Kirsten [name]. No language interpreter was used.   HPI Comments: Kirsten [name] is a 32 y.o. female who presents to the Emergency Department complaining of constant moderate bilateral knee pain onset yesterday while lying down and waking up in the morning. 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 Describes pain as sharp and throbbing. Reports taking motrin with moderate relief of symptoms. Reports pain is exacerbated in the morning and alleviated through out the day. Denies associated injuries, fever, fall, numbness, weakness, rash, and other arthralgias.   Past Medical History  Diagnosis Date  . Anxiety    Past Surgical History  Procedure Laterality Date  . Tonsillectomy     Family History  Problem Relation Age of Onset  . Diabetes Neg Hx   . Heart attack Neg Hx   . Hyperlipidemia Neg Hx   . Hypertension Neg Hx   . Sudden death Neg Hx    History  Substance Use Topics  . Smoking status: Current Some Day Smoker  . Smokeless tobacco: Not on file  . Alcohol Use: 0.0 oz/week     Comment: occassionally   OB History   Grav Para Term Preterm Abortions TAB SAB Ect Mult Living   6 2 2  2           Review of Systems  Constitutional: Negative for fever.  Musculoskeletal: Positive for arthralgias.  Skin: Negative for rash.  Psychiatric/Behavioral: Negative for confusion.   A complete 10 system review of systems was obtained and all systems are  negative except as noted in the HPI and PMHx.    Allergies  Bee venom  Home Medications   Current Outpatient Rx  Name  Route  Sig  Dispense  Refill  . ibuprofen (ADVIL,MOTRIN) 200 MG tablet   Oral   Take 400 mg by mouth every 6 (six) hours as needed for pain.         . Multiple Vitamin (MULTIVITAMIN) capsule   Oral   Take 1 capsule by mouth daily.          BP 108/77  Pulse 84  Temp(Src) 98.1 F (36.7 C) (Oral)  SpO2 100%  LMP 06/23/2013 Physical Exam  Nursing note and vitals reviewed. Constitutional: Kirsten [name] is oriented to person, place, and time. Kirsten [name] appears well-developed and well-nourished. No distress.  HENT:  Head: Normocephalic and atraumatic.  Right Ear: External ear normal.  Left Ear: External ear normal.  Nose: Nose normal.  Mouth/Throat: Oropharynx is clear and moist.  Eyes: Conjunctivae are normal.  Neck: Normal range of motion.  Cardiovascular: Normal rate, regular rhythm, normal heart sounds and intact distal pulses.   Pulses:      Popliteal pulses are 2+ on the right side, and 2+ on the left side.  Pulmonary/Chest: Effort normal and breath sounds normal. No stridor. No respiratory distress. Kirsten [name] has no wheezes. Kirsten [name] has no rales.  Abdominal: Soft. Kirsten [name] exhibits no distension.  Musculoskeletal: Normal range of motion. Kirsten [name] exhibits no  edema.  No swelling/erythema to bilateral calves and knees   TTP of bilateral patellar tendons   Neurological: Kirsten [name] is alert and oriented to person, place, and time. Kirsten [name] has normal strength.  Able to ambulate without an antalgic gait  Skin: Skin is warm and dry. No rash noted. Kirsten [name] is not diaphoretic. No erythema.  Psychiatric: Kirsten [name] has a normal mood and affect. Kirsten [name] behavior is normal.    ED Course  Procedures   COORDINATION OF CARE:  Nursing notes reviewed. Vital signs reviewed. Initial pt interview and examination performed.    Treatment plan initiated: Medications  traMADol (ULTRAM) tablet 50 mg (50 mg Oral Given  07/18/13 1122)     Initial diagnostic testing ordered.    Labs Review Labs Reviewed - No data to display Imaging Review No results found.  EKG Interpretation   None       MDM   1. Bilateral knee pain    Pt presents with bilateral knee pain. No swelling, erythema, rash. Full ROM. No pain in other joints. Neurovascularly intact. Compartment soft. I do not believe imaging would be helpful in the emergency department. Discussed possibility of inflammatory arthritis with Kirsten [name]. Rest, ice, elevation, compression. Resource guide given. Follow up with PCP. Return instructions given. Vital signs stable for discharge. Kirsten [name] / Family / Caregiver informed of clinical course, understand medical decision-making process, and agree with plan.   I personally performed the services described in Kirsten documentation, which was scribed in my presence. The recorded information has been reviewed and is accurate.     Mora Bellman, PA-C 07/18/13 1209

## 2013-07-18 NOTE — ED Provider Notes (Signed)
Medical screening examination/treatment/procedure(s) were performed by non-physician practitioner and as supervising physician I was immediately available for consultation/collaboration.  Toy Baker, MD 07/18/13 [phone]

## 2013-07-18 NOTE — ED Notes (Signed)
Pt c/o bilateral knee pain, "sharp/constant", onset yesterday am. 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 Started while laying in bed.

## 2013-08-23 ENCOUNTER — Emergency Department (HOSPITAL_BASED_OUTPATIENT_CLINIC_OR_DEPARTMENT_OTHER)
Admission: EM | Admit: 2013-08-23 | Discharge: 2013-08-23 | Disposition: A | Discharge status: Home / Self Care | Payer: Self-pay | Attending: Emergency Medicine | Admitting: Emergency Medicine

## 2013-08-23 ENCOUNTER — Encounter (HOSPITAL_BASED_OUTPATIENT_CLINIC_OR_DEPARTMENT_OTHER): Payer: Self-pay | Admitting: Emergency Medicine

## 2013-08-23 DIAGNOSIS — Z79899 Other long term (current) drug therapy: Secondary | ICD-10-CM | POA: Insufficient documentation

## 2013-08-23 DIAGNOSIS — F411 Generalized anxiety disorder: Secondary | ICD-10-CM | POA: Insufficient documentation

## 2013-08-23 DIAGNOSIS — F17200 Nicotine dependence, unspecified, uncomplicated: Secondary | ICD-10-CM | POA: Insufficient documentation

## 2013-08-23 DIAGNOSIS — S7012XA Contusion of left thigh, initial encounter: Secondary | ICD-10-CM

## 2013-08-23 DIAGNOSIS — M7981 Nontraumatic hematoma of soft tissue: Secondary | ICD-10-CM | POA: Insufficient documentation

## 2013-08-23 MED ORDER — HYDROCODONE-ACETAMINOPHEN 5-325 MG PO TABS: 2.0000 | ORAL_TABLET | ORAL | Status: DC | PRN

## 2013-08-23 NOTE — ED Provider Notes (Signed)
CSN: [id number]     Arrival date & time 08/23/13  1745 History   None    Chief Complaint  Patient presents with  . Leg Pain   (Consider location/radiation/quality/duration/timing/severity/associated sxs/prior Treatment) HPI Kirsten [name] is a 33 y.o. female who presents to the ED with leg pain. The pain started yesterday in the inner aspect of the left upper thigh. The onset was sudden and it has gotten progressively worse over the past 24 hours. Taking motrin every 4 hours. The pain is severe. There is swelling and bruising noted to the area. 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 The pain increases with pressure or ambulation.   Past Medical History  Diagnosis Date  . Anxiety    Past Surgical History  Procedure Laterality Date  . Tonsillectomy     Family History  Problem Relation Age of Onset  . Diabetes Neg Hx   . Heart attack Neg Hx   . Hyperlipidemia Neg Hx   . Hypertension Neg Hx   . Sudden death Neg Hx    History  Substance Use Topics  . Smoking status: Current Some Day Smoker  . Smokeless tobacco: Not on file  . Alcohol Use: 0.0 oz/week     Comment: occassionally   OB History   Grav Para Term Preterm Abortions TAB SAB Ect Mult Living   6 2 2  2           Review of Systems Negative except as stated in HPI  Allergies  Bee venom  Home Medications   Current Outpatient Rx  Name  Route  Sig  Dispense  Refill  . ibuprofen (ADVIL,MOTRIN) 200 MG tablet   Oral   Take 400 mg by mouth every 6 (six) hours as needed for pain.         . Multiple Vitamin (MULTIVITAMIN) capsule   Oral   Take 1 capsule by mouth daily.         . traMADol (ULTRAM) 50 MG tablet   Oral   Take 1 tablet (50 mg total) by mouth every 6 (six) hours as needed.   15 tablet   0    BP 112/99  Pulse 93  Temp(Src) 98.2 F (36.8 C) (Oral)  Resp 18  Ht 5\' 4"  (1.626 m)  Wt 125 lb (56.7 kg)  BMI  21.45 kg/m2  SpO2 100%  LMP 08/09/2013 Physical Exam  Nursing note and vitals reviewed. Constitutional: She is oriented to person, place, and time. She appears well-developed and well-nourished. No distress.  HENT:  Head: Normocephalic and atraumatic.  Eyes: EOM are normal.  Neck: Neck supple.  Cardiovascular: Normal rate.   Pulmonary/Chest: Effort normal.  Musculoskeletal: Normal range of motion.       Left upper leg: She exhibits tenderness and swelling.       Legs: Tenderness, swelling and ecchymosis noted to the upper inner aspect of the left thigh. Femoral pulse strong, pedal pulse strong, adequate circulation, good touch sensation.  Neurological: She is alert and oriented to person, place, and time. She has normal strength. No cranial nerve deficit or sensory deficit. Gait normal.  Skin: Skin is warm and dry.  Psychiatric: She has a normal mood and affect. Her behavior is normal.    ED Course: Dr. Judd Lien in to examine the patient. Will treat hematoma.   Procedures   MDM  33 y.o. female with swelling and  bruising to the inner aspect of the left thigh. Will treat for pain. Dr. Judd Lienelo discussed with the patient rest, elevation, ice and follow up as needed. Patient voices understanding.    Medication List    TAKE these medications       HYDROcodone-acetaminophen 5-325 MG per tablet  Commonly known as:  NORCO/VICODIN  Take 2 tablets by mouth every 4 (four) hours as needed.      ASK your doctor about these medications       ibuprofen 200 MG tablet  Commonly known as:  ADVIL,MOTRIN  Take 400 mg by mouth every 6 (six) hours as needed for pain.     multivitamin capsule  Take 1 capsule by mouth daily.     traMADol 50 MG tablet  Commonly known as:  ULTRAM  Take 1 tablet (50 mg total) by mouth every 6 (six) hours as needed.           Janne NapoleonHope M Neese, TexasNP 08/23/13 2251

## 2013-08-23 NOTE — Discharge Instructions (Signed)
Take ibuprofen as needed. Do not take the narcotic if you are driving as it will make you sleepy. Return for worsening symptoms   Hematoma A hematoma is a collection of blood. The collection of blood can turn into a hard, painful lump under the skin. Your skin may turn blue or yellow if the hematoma is close to the surface of the skin. Most hematomas get better in a few days to weeks. Some hematomas are serious and need medical care. Hematomas can be very small or very big. HOME CARE  Apply ice to the injured area:  Put ice in a plastic bag.  Place a towel between your skin and the bag.  Leave the ice on for 20 minutes, 2 3 times a day for the first 1 to 2 days.  After the first 2 days, switch to using warm packs on the injured area.  Raise (elevate) the injured area to lessen pain and puffiness (swelling). You may also wrap the area with an elastic bandage. Make sure the bandage is not wrapped too tight.  If you have a painful hematoma on your leg or foot, you may use crutches for a couple days.  Only take medicines as told by your doctor. GET HELP RIGHT AWAY IF:   Your pain gets worse.  Your pain is not controlled with medicine.  You have a fever.  Your puffiness gets worse.  Your skin turns more blue or yellow.  Your skin over the hematoma breaks or starts bleeding.  Your hematoma is in your chest or belly (abdomen) and you are short of breath, feel weak, or have a change in consciousness.  Your hematoma is on your scalp and you have a headache that gets worse or a change in alertness or consciousness. MAKE SURE YOU:   Understand these instructions.  Will watch your condition.  Will get help right away if you are not doing well or get worse. Document Released: 09/02/2004 Document Revised: 03/28/2013 Document Reviewed: 01/03/2013 [facility] CenterExit[facility] Patient Information 2014 [place], [place].

## 2013-08-23 NOTE — ED Notes (Addendum)
Left thigh pain since yesterday. Swelling.

## 2013-08-24 NOTE — ED Provider Notes (Signed)
Medical screening examination/treatment/procedure(s) were conducted as a shared visit with non-physician practitioner(s) and myself.  I personally evaluated the patient during the encounter.  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  She presents with complaints of pain and swelling in the left groin and upper thigh that she says started after doing a split.    On exam, vitals are stable and she is afebrile.  There is a swollen, ecchymotic area to the inside of the left upper thigh.  Distal pulses, motor, and sensory are intact.  This appears to be a groin/hip abductor strain, possible tear.  Will recommend rest and time.  If not improving, she needs to follow up with orthopedics or her pcp.  Return prn.     Geoffery Lyonsouglas Jacub Waiters, MD 08/24/13 [phone]

## 2014-03-05 ENCOUNTER — Emergency Department (HOSPITAL_COMMUNITY): Payer: Self-pay

## 2014-03-05 ENCOUNTER — Encounter (HOSPITAL_COMMUNITY): Payer: Self-pay | Admitting: Emergency Medicine

## 2014-03-05 ENCOUNTER — Emergency Department (HOSPITAL_COMMUNITY)
Admission: EM | Admit: 2014-03-05 | Discharge: 2014-03-05 | Disposition: A | Discharge status: Home / Self Care | Payer: Self-pay | Attending: Emergency Medicine | Admitting: Emergency Medicine

## 2014-03-05 DIAGNOSIS — M542 Cervicalgia: Secondary | ICD-10-CM

## 2014-03-05 DIAGNOSIS — S0993XA Unspecified injury of face, initial encounter: Secondary | ICD-10-CM | POA: Insufficient documentation

## 2014-03-05 DIAGNOSIS — Z8659 Personal history of other mental and behavioral disorders: Secondary | ICD-10-CM | POA: Insufficient documentation

## 2014-03-05 DIAGNOSIS — S0990XA Unspecified injury of head, initial encounter: Secondary | ICD-10-CM | POA: Insufficient documentation

## 2014-03-05 DIAGNOSIS — F17200 Nicotine dependence, unspecified, uncomplicated: Secondary | ICD-10-CM | POA: Insufficient documentation

## 2014-03-05 DIAGNOSIS — S199XXA Unspecified injury of neck, initial encounter: Secondary | ICD-10-CM

## 2014-03-05 DIAGNOSIS — IMO0002 Reserved for concepts with insufficient information to code with codable children: Secondary | ICD-10-CM

## 2014-03-05 DIAGNOSIS — F10229 Alcohol dependence with intoxication, unspecified: Secondary | ICD-10-CM | POA: Insufficient documentation

## 2014-03-05 LAB — CBC
HCT: 41.4 % (ref 36.0–46.0)
Hemoglobin: 14 g/dL (ref 12.0–15.0)
MCH: 33.1 pg (ref 26.0–34.0)
MCHC: 33.8 g/dL (ref 30.0–36.0)
MCV: 97.9 fL (ref 78.0–100.0)
Platelets: 157 10*3/uL (ref 150–400)
RBC: 4.23 MIL/uL (ref 3.87–5.11)
RDW: 12.3 % (ref 11.5–15.5)
WBC: 4.9 10*3/uL (ref 4.0–10.5)

## 2014-03-05 LAB — BASIC METABOLIC PANEL
Anion gap: 16 — ABNORMAL HIGH (ref 5–15)
BUN: 5 mg/dL — AB (ref 6–23)
CHLORIDE: 107 meq/L (ref 96–112)
CO2: 22 meq/L (ref 19–32)
CREATININE: 0.56 mg/dL (ref 0.50–1.10)
Calcium: 9 mg/dL (ref 8.4–10.5)
GFR calc Af Amer: >90 mL/min (ref >90)
GFR calc non Af Amer: >90 mL/min (ref >90)
Glucose, Bld: 90 mg/dL (ref 70–99)
Potassium: 3.8 mEq/L (ref 3.7–5.3)
Sodium: 145 mEq/L (ref 137–147)
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
## 2014-03-05 NOTE — Discharge Instructions (Signed)
Assault, General °Assault includes any behavior, whether intentional or reckless, which results in bodily injury to another person and/or damage to property. Included in this would be any behavior, intentional or reckless, that by its nature would be understood (interpreted) by a reasonable person as intent to harm another person or to damage his/her property. Threats may be oral or written. They may be communicated through regular mail, computer, fax, or phone. These threats may be direct or implied. °FORMS OF ASSAULT INCLUDE: °· Physically assaulting a person. This includes physical threats to inflict physical harm as well as: °¨ Slapping. °¨ Hitting. °¨ Poking. °¨ Kicking. °¨ Punching. °¨ Pushing. °· Arson. °· Sabotage. °· Equipment vandalism. °· Damaging or destroying property. °· Throwing or hitting objects. °· Displaying a weapon or an object that appears to be a weapon in a threatening manner. °¨ Carrying a firearm of any kind. °¨ Using a weapon to harm someone. °· Using greater physical size/strength to intimidate another. °¨ Making intimidating or threatening gestures. °¨ Bullying. °¨ Hazing. °· Intimidating, threatening, hostile, or abusive language directed toward another person. °¨ It communicates the intention to engage in violence against that person. And it leads a reasonable person to expect that violent behavior may occur. °· Stalking another person. °IF IT HAPPENS AGAIN: °· Immediately call for emergency help (911 in [place] °· If someone poses clear and immediate danger to you, seek legal authorities to have a protective or restraining order put in place. °· Less threatening assaults can at least be reported to authorities. °STEPS TO TAKE IF A SEXUAL ASSAULT HAS HAPPENED °· Go to an area of safety. This may include a shelter or staying with a friend. Stay away from the area where you have been attacked. A large percentage of sexual assaults are caused by a friend, relative or associate. °· If  medications were given by your caregiver, take them as directed for the full length of time prescribed. °· Only take over-the-counter or prescription medicines for pain, discomfort, or fever as directed by your caregiver. °· If you have come in contact with a sexual disease, find out if you are to be tested again. If your caregiver is concerned about the HIV/AIDS virus, he/she may require you to have continued testing for several months. °· For the protection of your privacy, test results can not be given over the phone. Make sure you receive the results of your test. If your test results are not back during your visit, make an appointment with your caregiver to find out the results. Do not assume everything is normal if you have not heard from your caregiver or the medical facility. It is important for you to follow up on all of your test results. °· File appropriate papers with authorities. This is important in all assaults, even if it has occurred in a family or by a friend. °SEEK MEDICAL CARE IF: °· You have new problems because of your injuries. °· You have problems that may be because of the medicine you are taking, such as: °¨ Rash. °¨ Itching. °¨ Swelling. °¨ Trouble breathing. °· You develop belly (abdominal) pain, feel sick to your stomach (nausea) or are vomiting. °· You begin to run a temperature. °· You need supportive care or referral to a rape crisis center. These are centers with trained personnel who can help you get through this ordeal. °SEEK IMMEDIATE MEDICAL CARE IF: °· You are afraid of being threatened, beaten, or abused. In [place] call 911. °· You   receive new injuries related to abuse.  You develop severe pain in any area injured in the assault or have any change in your condition that concerns you.  You faint or lose consciousness.  You develop chest pain or shortness of breath. Document Released: 07/26/2005 Document Revised: 10/18/2011 Document Reviewed: 03/13/2008 [facility] Patient  Information 2015 [place], [place]. This information is not intended to replace advice given to you by your health care provider. Make sure you discuss any questions you have with your health care provider.  Cervical Sprain A cervical sprain is an injury in the neck in which the strong, fibrous tissues (ligaments) that connect your neck bones stretch or tear. Cervical sprains can range from mild to severe. Severe cervical sprains can cause the neck vertebrae to be unstable. This can lead to damage of the spinal cord and can result in serious nervous system problems. The amount of time it takes for a cervical sprain to get better depends on the cause and extent of the injury. Most cervical sprains heal in 1 to 3 weeks. CAUSES  Severe cervical sprains may be caused by:   Contact sport injuries (such as from football, rugby, wrestling, hockey, auto racing, gymnastics, diving, martial arts, or boxing).   Motor vehicle collisions.   Whiplash injuries. This is an injury from a sudden forward and backward whipping movement of the head and neck.  Falls.  Mild cervical sprains may be caused by:   Being in an awkward position, such as while cradling a telephone between your ear and shoulder.   Sitting in a chair that does not offer proper support.   Working at a poorly Landscape [employer] station.   Looking up or down for long periods of time.  SYMPTOMS   Pain, soreness, stiffness, or a burning sensation in the front, back, or sides of the neck. This discomfort may develop immediately after the injury or slowly, 24 hours or more after the injury.   Pain or tenderness directly in the middle of the back of the neck.   Shoulder or upper back pain.   Limited ability to move the neck.   Headache.   Dizziness.   Weakness, numbness, or tingling in the hands or arms.   Muscle spasms.   Difficulty swallowing or chewing.   Tenderness and swelling of the neck.  DIAGNOSIS  Most of  the time your health care provider can diagnose a cervical sprain by taking your history and doing a physical exam. Your health care provider will ask about previous neck injuries and any known neck problems, such as arthritis in the neck. X-rays may be taken to find out if there are any other problems, such as with the bones of the neck. Other tests, such as a CT scan or MRI, may also be needed.  TREATMENT  Treatment depends on the severity of the cervical sprain. Mild sprains can be treated with rest, keeping the neck in place (immobilization), and pain medicines. Severe cervical sprains are immediately immobilized. Further treatment is done to help with pain, muscle spasms, and other symptoms and may include:  Medicines, such as pain relievers, numbing medicines, or muscle relaxants.   Physical therapy. This may involve stretching exercises, strengthening exercises, and posture training. Exercises and improved posture can help stabilize the neck, strengthen muscles, and help stop symptoms from returning.  HOME CARE INSTRUCTIONS   Put ice on the injured area.   Put ice in a plastic bag.   Place a towel between your skin  and the bag.   Leave the ice on for 15-20 minutes, 3-4 times a day.   If your injury was severe, you may have been given a cervical collar to wear. A cervical collar is a two-piece collar designed to keep your neck from moving while it heals.  Do not remove the collar unless instructed by your health care provider.  If you have long hair, keep it outside of the collar.  Ask your health care provider before making any adjustments to your collar. Minor adjustments may be required over time to improve comfort and reduce pressure on your chin or on the back of your head.  Ifyou are allowed to remove the collar for cleaning or bathing, follow your health care provider's instructions on how to do so safely.  Keep your collar clean by wiping it with mild soap and water  and drying it completely. If the collar you have been given includes removable pads, remove them every 1-2 days and hand wash them with soap and water. Allow them to air dry. They should be completely dry before you wear them in the collar.  If you are allowed to remove the collar for cleaning and bathing, wash and dry the skin of your neck. Check your skin for irritation or sores. If you see any, tell your health care provider.  Do not drive while wearing the collar.   Only take over-the-counter or prescription medicines for pain, discomfort, or fever as directed by your health care provider.   Keep all follow-up appointments as directed by your health care provider.   Keep all physical therapy appointments as directed by your health care provider.   Make any needed adjustments to your workstation to promote good posture.   Avoid positions and activities that make your symptoms worse.   Warm up and stretch before being active to help prevent problems.  SEEK MEDICAL CARE IF:   Your pain is not controlled with medicine.   You are unable to decrease your pain medicine over time as planned.   Your activity level is not improving as expected.  SEEK IMMEDIATE MEDICAL CARE IF:   You develop any bleeding.  You develop stomach upset.  You have signs of an allergic reaction to your medicine.   Your symptoms get worse.   You develop new, unexplained symptoms.   You have numbness, tingling, weakness, or paralysis in any part of your body.  MAKE SURE YOU:   Understand these instructions.  Will watch your condition.  Will get help right away if you are not doing well or get worse. Document Released: 05/23/2007 Document Revised: 07/31/2013 Document Reviewed: 01/31/2013 [facility] Patient Information 2015 [place], [place]. This information is not intended to replace advice given to you by your health care provider. Make sure you discuss any questions you have with your health  care provider.  Domestic Abuse You are being battered or abused if someone close to you hits, pushes, or physically hurts you in any way. You also are being abused if you are forced into activities. You are being sexually abused if you are forced to have sexual contact of any kind. You are being emotionally abused if you are made to feel worthless or if you are constantly threatened. It is important to remember that help is available. No one has the right to abuse you. PREVENTION OF FURTHER ABUSE  Learn the warning signs of danger. This varies with situations but may include: the use of alcohol, threats, isolation from  friends and family, or forced sexual contact. Leave if you feel that violence is going to occur.  If you are attacked or beaten, report it to the police so the abuse is documented. You do not have to press charges. The police can protect you while you or the attackers are leaving. Get the officer's name and badge number and a copy of the report.  Find someone you can trust and tell them what is happening to you: your caregiver, a nurse, clergy member, close friend or family member. Feeling ashamed is natural, but remember that you have done nothing wrong. No one deserves abuse.  It is important to develop a safety plan if you decide to leave the relationship. You may be at risk of harm if your abuser knows you are leaving. Include the following steps in your plan:  Keep extra clothing for you and your children, medicines, money, important phone numbers and papers, and an extra set of car and house keys at a friend's or neighbor's house.  Tell a supportive friend or family member that you may show up at any time of day or night in an emergency.  If you do not have a close friend or family member, make a list of other safe places to go, such as shelters and crisis centers. Keep an abuse hotline number available.  Consider filing a restraining order if your abuser stalks, harasses, or  threatens you in any way. Document Released: 07/23/2000 Document Revised: 10/18/2011 Document Reviewed: 10/01/2010 [facility] Patient Information 2015 [place]. This information is not intended to replace advice given to you by your health care provider. Make sure you discuss any questions you have with your health care provider.  Head Injury You have received a head injury. It does not appear serious at this time. Headaches and vomiting are common following head injury. It should be easy to awaken from sleeping. Sometimes it is necessary for you to stay in the emergency department for a while for observation. Sometimes admission to the hospital may be needed. After injuries such as yours, most problems occur within the first 24 hours, but side effects may occur up to 7-10 days after the injury. It is important for you to carefully monitor your condition and contact your health care provider or seek immediate medical care if there is a change in your condition. WHAT ARE THE TYPES OF HEAD INJURIES? Head injuries can be as minor as a bump. Some head injuries can be more severe. More severe head injuries include:  A jarring injury to the brain (concussion).  A bruise of the brain (contusion). This mean there is bleeding in the brain that can cause swelling.  A cracked skull (skull fracture).  Bleeding in the brain that collects, clots, and forms a bump (hematoma). WHAT CAUSES A HEAD INJURY? A serious head injury is most likely to happen to someone who is in a car wreck and is not wearing a seat belt. Other causes of major head injuries include bicycle or motorcycle accidents, sports injuries, and falls. HOW ARE HEAD INJURIES DIAGNOSED? A complete history of the event leading to the injury and your current symptoms will be helpful in diagnosing head injuries. Many times, pictures of the brain, such as CT or MRI are needed to see the extent of the injury. Often, an overnight hospital stay is  necessary for observation.  WHEN SHOULD I SEEK IMMEDIATE MEDICAL CARE?  You should get help right away if:  You have confusion or  drowsiness.  You feel sick to your stomach (nauseous) or have continued, forceful vomiting.  You have dizziness or unsteadiness that is getting worse.  You have severe, continued headaches not relieved by medicine. Only take over-the-counter or prescription medicines for pain, fever, or discomfort as directed by your health care provider.  You do not have normal function of the arms or legs or are unable to walk.  You notice changes in the black spots in the center of the colored part of your eye (pupil).  You have a clear or bloody fluid coming from your nose or ears.  You have a loss of vision. During the next 24 hours after the injury, you must stay with someone who can watch you for the warning signs. This person should contact local emergency services (911 in the [place] if you have seizures, you become unconscious, or you are unable to wake up. HOW CAN I PREVENT A HEAD INJURY IN THE FUTURE? The most important factor for preventing major head injuries is avoiding motor vehicle accidents. To minimize the potential for damage to your head, it is crucial to wear seat belts while riding in motor vehicles. Wearing helmets while bike riding and playing collision sports (like football) is also helpful. Also, avoiding dangerous activities around the house will further help reduce your risk of head injury.  WHEN CAN I RETURN TO NORMAL ACTIVITIES AND ATHLETICS? You should be reevaluated by your health care provider before returning to these activities. If you have any of the following symptoms, you should not return to activities or contact sports until 1 week after the symptoms have stopped:  Persistent headache.  Dizziness or vertigo.  Poor attention and concentration.  Confusion.  Memory problems.  Nausea or vomiting.  Fatigue or tire  easily.  Irritability.  Intolerant of bright lights or loud noises.  Anxiety or depression.  Disturbed sleep. MAKE SURE YOU:   Understand these instructions.  Will watch your condition.  Will get help right away if you are not doing well or get worse. Document Released: 07/26/2005 Document Revised: 07/31/2013 Document Reviewed: 04/02/2013 [facility] Patient Information 2015 [place], [place]. This information is not intended to replace advice given to you by your health care provider. Make sure you discuss any questions you have with your health care provider.
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
## 2014-03-06 NOTE — ED Provider Notes (Signed)
Medical screening examination/treatment/procedure(s) were performed by non-physician practitioner and as supervising physician I was immediately available for consultation/collaboration.   EKG Interpretation None        Glynn OctaveStephen Orin Eberwein, MD 03/06/14 0021

## 2014-03-12 ENCOUNTER — Encounter (HOSPITAL_COMMUNITY): Payer: Self-pay | Admitting: *Deleted

## 2014-03-12 ENCOUNTER — Encounter (HOSPITAL_COMMUNITY): Payer: Self-pay | Admitting: Emergency Medicine

## 2014-03-12 ENCOUNTER — Observation Stay (HOSPITAL_COMMUNITY)
Admission: AD | Admit: 2014-03-12 | Discharge: 2014-03-12 | Disposition: A | Discharge status: Other Acute Inpatient Hospital | Payer: Self-pay | Source: Intra-hospital | Attending: Family | Admitting: Family

## 2014-03-12 ENCOUNTER — Emergency Department (HOSPITAL_COMMUNITY)
Admission: EM | Admit: 2014-03-12 | Discharge: 2014-03-12 | Disposition: A | Discharge status: Other Acute Inpatient Hospital | Payer: Self-pay | Attending: Emergency Medicine | Admitting: Emergency Medicine

## 2014-03-12 ENCOUNTER — Observation Stay (HOSPITAL_BASED_OUTPATIENT_CLINIC_OR_DEPARTMENT_OTHER)
Admission: AD | Admit: 2014-03-12 | Discharge: 2014-03-13 | Disposition: A | Discharge status: Home / Self Care | Payer: Self-pay | Source: Intra-hospital | Attending: Psychiatry | Admitting: Psychiatry

## 2014-03-12 DIAGNOSIS — F1020 Alcohol dependence, uncomplicated: Secondary | ICD-10-CM | POA: Diagnosis present

## 2014-03-12 DIAGNOSIS — F1010 Alcohol abuse, uncomplicated: Secondary | ICD-10-CM | POA: Diagnosis present

## 2014-03-12 DIAGNOSIS — F329 Major depressive disorder, single episode, unspecified: Secondary | ICD-10-CM | POA: Insufficient documentation

## 2014-03-12 DIAGNOSIS — F10988 Alcohol use, unspecified with other alcohol-induced disorder: Principal | ICD-10-CM | POA: Insufficient documentation

## 2014-03-12 DIAGNOSIS — F32A Depression, unspecified: Secondary | ICD-10-CM

## 2014-03-12 DIAGNOSIS — F10920 Alcohol use, unspecified with intoxication, uncomplicated: Secondary | ICD-10-CM

## 2014-03-12 DIAGNOSIS — F3289 Other specified depressive episodes: Secondary | ICD-10-CM | POA: Insufficient documentation

## 2014-03-12 DIAGNOSIS — F1110 Opioid abuse, uncomplicated: Secondary | ICD-10-CM | POA: Insufficient documentation

## 2014-03-12 DIAGNOSIS — F17200 Nicotine dependence, unspecified, uncomplicated: Secondary | ICD-10-CM | POA: Insufficient documentation

## 2014-03-12 DIAGNOSIS — F332 Major depressive disorder, recurrent severe without psychotic features: Secondary | ICD-10-CM | POA: Insufficient documentation

## 2014-03-12 DIAGNOSIS — Z3202 Encounter for pregnancy test, result negative: Secondary | ICD-10-CM | POA: Insufficient documentation

## 2014-03-12 DIAGNOSIS — F1410 Cocaine abuse, uncomplicated: Secondary | ICD-10-CM | POA: Insufficient documentation

## 2014-03-12 HISTORY — DX: Alcohol abuse, uncomplicated: F10.10

## 2014-03-12 HISTORY — DX: Other psychoactive substance abuse, uncomplicated: F19.10

## 2014-03-12 LAB — ACETAMINOPHEN LEVEL

## 2014-03-12 LAB — CBC WITH DIFFERENTIAL/PLATELET
BASOS ABS: 0.1 10*3/uL (ref 0.0–0.1)
BASOS PCT: 1 % (ref 0–1)
Eosinophils Absolute: 0.1 10*3/uL (ref 0.0–0.7)
Eosinophils Relative: 1 % (ref 0–5)
HCT: 45.8 % (ref 36.0–46.0)
Hemoglobin: 15.5 g/dL — ABNORMAL HIGH (ref 12.0–15.0)
Lymphocytes Relative: 32 % (ref 12–46)
Lymphs Abs: 1.5 10*3/uL (ref 0.7–4.0)
MCH: 33.5 pg (ref 26.0–34.0)
MCHC: 33.8 g/dL (ref 30.0–36.0)
MCV: 98.9 fL (ref 78.0–100.0)
Monocytes Absolute: 0.5 10*3/uL (ref 0.1–1.0)
Monocytes Relative: 11 % (ref 3–12)
NEUTROS PCT: 55 % (ref 43–77)
Neutro Abs: 2.6 10*3/uL (ref 1.7–7.7)
Platelets: 148 10*3/uL — ABNORMAL LOW (ref 150–400)
RBC: 4.63 MIL/uL (ref 3.87–5.11)
RDW: 12.2 % (ref 11.5–15.5)
WBC: 4.7 10*3/uL (ref 4.0–10.5)

## 2014-03-12 LAB — POC URINE PREG, ED: PREG TEST UR: NEGATIVE

## 2014-03-12 LAB — COMPREHENSIVE METABOLIC PANEL
ALBUMIN: 3.8 g/dL (ref 3.5–5.2)
ALK PHOS: 82 U/L (ref 39–117)
ALT: 51 U/L — ABNORMAL HIGH (ref 0–35)
AST: 163 U/L — AB (ref 0–37)
Anion gap: 18 — ABNORMAL HIGH (ref 5–15)
BILIRUBIN TOTAL: 0.2 mg/dL — AB (ref 0.3–1.2)
BUN: 5 mg/dL — ABNORMAL LOW (ref 6–23)
CO2: 21 mEq/L (ref 19–32)
CREATININE: 0.66 mg/dL (ref 0.50–1.10)
Calcium: 8.4 mg/dL (ref 8.4–10.5)
Chloride: 106 mEq/L (ref 96–112)
GFR calc Af Amer: >90 mL/min (ref >90)
GFR calc non Af Amer: >90 mL/min (ref >90)
Glucose, Bld: 138 mg/dL — ABNORMAL HIGH (ref 70–99)
POTASSIUM: 3.4 meq/L — AB (ref 3.7–5.3)
Sodium: 145 mEq/L (ref 137–147)
TOTAL PROTEIN: 6.9 g/dL (ref 6.0–8.3)

## 2014-03-12 LAB — ETHANOL: Alcohol, Ethyl (B): 426 mg/dL (ref 0–11)

## 2014-03-12 LAB — RAPID URINE DRUG SCREEN, HOSP PERFORMED
AMPHETAMINES: NOT DETECTED
BARBITURATES: NOT DETECTED
BENZODIAZEPINES: NOT DETECTED
COCAINE: POSITIVE — AB
Opiates: POSITIVE — AB
TETRAHYDROCANNABINOL: NOT DETECTED

## 2014-03-12 LAB — SALICYLATE LEVEL: Salicylate Lvl: <2 mg/dL — ABNORMAL LOW (ref 2.8–20.0)

## 2014-03-12 MED ORDER — HYDROXYZINE HCL 25 MG PO TABS: 25.0000 mg | ORAL_TABLET | Freq: Four times a day (QID) | ORAL | Status: DC | PRN

## 2014-03-12 MED ORDER — LOPERAMIDE HCL 2 MG PO CAPS: 2.0000 mg | ORAL_CAPSULE | ORAL | Status: DC | PRN

## 2014-03-12 MED ORDER — THIAMINE HCL 100 MG/ML IJ SOLN
100.0000 mg | Freq: Every day | INTRAMUSCULAR | Status: DC
  Administered 2014-03-12: 100 mg via INTRAVENOUS
  Filled 2014-03-12: qty 2

## 2014-03-12 MED ORDER — ACETAMINOPHEN 325 MG PO TABS: 650.0000 mg | ORAL_TABLET | Freq: Four times a day (QID) | ORAL | Status: DC | PRN

## 2014-03-12 MED ORDER — HYDROXYZINE HCL 25 MG PO TABS
25.0000 mg | ORAL_TABLET | Freq: Four times a day (QID) | ORAL | Status: DC | PRN
  Administered 2014-03-12 – 2014-03-13 (×2): 25 mg via ORAL
  Filled 2014-03-12 (×2): qty 1

## 2014-03-12 MED ORDER — LORAZEPAM 2 MG/ML IJ SOLN
2.0000 mg | Freq: Once | INTRAMUSCULAR | Status: AC
  Administered 2014-03-12: 2 mg via INTRAVENOUS
  Filled 2014-03-12: qty 1

## 2014-03-12 MED ORDER — ACETAMINOPHEN 325 MG PO TABS: 650.0000 mg | ORAL_TABLET | ORAL | Status: DC | PRN

## 2014-03-12 MED ORDER — CHLORDIAZEPOXIDE HCL 25 MG PO CAPS
25.0000 mg | ORAL_CAPSULE | Freq: Four times a day (QID) | ORAL | Status: DC | PRN
  Administered 2014-03-12: 25 mg via ORAL
  Filled 2014-03-12: qty 1

## 2014-03-12 MED ORDER — HYDROXYZINE HCL 25 MG PO TABS
25.0000 mg | ORAL_TABLET | Freq: Four times a day (QID) | ORAL | Status: DC | PRN
Start: 2014-03-12 — End: 2014-03-12

## 2014-03-12 MED ORDER — CHLORDIAZEPOXIDE HCL 25 MG PO CAPS: 25.0000 mg | ORAL_CAPSULE | ORAL | Status: DC

## 2014-03-12 MED ORDER — ADULT MULTIVITAMIN W/MINERALS CH: 1.0000 | ORAL_TABLET | Freq: Every morning | ORAL | Status: DC

## 2014-03-12 MED ORDER — THIAMINE HCL 100 MG/ML IJ SOLN: 100.0000 mg | Freq: Once | INTRAMUSCULAR | Status: DC

## 2014-03-12 MED ORDER — ADULT MULTIVITAMIN W/MINERALS CH
1.0000 | ORAL_TABLET | Freq: Every day | ORAL | Status: DC
  Filled 2014-03-12 (×3): qty 1

## 2014-03-12 MED ORDER — ALUM & MAG HYDROXIDE-SIMETH 200-200-20 MG/5ML PO SUSP: 30.0000 mL | ORAL | Status: DC | PRN

## 2014-03-12 MED ORDER — CHLORDIAZEPOXIDE HCL 25 MG PO CAPS
25.0000 mg | ORAL_CAPSULE | Freq: Four times a day (QID) | ORAL | Status: DC
  Administered 2014-03-12 – 2014-03-13 (×3): 25 mg via ORAL
  Filled 2014-03-12 (×4): qty 1

## 2014-03-12 MED ORDER — CHLORDIAZEPOXIDE HCL 25 MG PO CAPS: 25.0000 mg | ORAL_CAPSULE | Freq: Every day | ORAL | Status: DC

## 2014-03-12 MED ORDER — MAGNESIUM HYDROXIDE 400 MG/5ML PO SUSP: 30.0000 mL | Freq: Every day | ORAL | Status: DC | PRN

## 2014-03-12 MED ORDER — ONDANSETRON HCL 4 MG/2ML IJ SOLN
4.0000 mg | Freq: Once | INTRAMUSCULAR | Status: AC
  Administered 2014-03-12: 4 mg via INTRAVENOUS
  Filled 2014-03-12: qty 2

## 2014-03-12 MED ORDER — LORAZEPAM 1 MG PO TABS
1.0000 mg | ORAL_TABLET | Freq: Three times a day (TID) | ORAL | Status: DC | PRN
  Administered 2014-03-12: 1 mg via ORAL
  Filled 2014-03-12: qty 1

## 2014-03-12 MED ORDER — CHLORDIAZEPOXIDE HCL 25 MG PO CAPS: 25.0000 mg | ORAL_CAPSULE | Freq: Four times a day (QID) | ORAL | Status: DC | PRN

## 2014-03-12 MED ORDER — TRAZODONE HCL 50 MG PO TABS
50.0000 mg | ORAL_TABLET | Freq: Every evening | ORAL | Status: DC | PRN
Start: 2014-03-12 — End: 2014-03-14
  Administered 2014-03-12 – 2014-03-13 (×2): 50 mg via ORAL
  Filled 2014-03-12 (×5): qty 1

## 2014-03-12 MED ORDER — CHLORDIAZEPOXIDE HCL 25 MG PO CAPS
ORAL_CAPSULE | ORAL | Status: AC
  Filled 2014-03-12: qty 1

## 2014-03-12 MED ORDER — LOPERAMIDE HCL 2 MG PO CAPS
2.0000 mg | ORAL_CAPSULE | ORAL | Status: DC | PRN
  Administered 2014-03-13 (×2): 2 mg via ORAL
  Filled 2014-03-12 (×2): qty 1

## 2014-03-12 MED ORDER — LORAZEPAM 1 MG PO TABS: 0.0000 mg | ORAL_TABLET | Freq: Two times a day (BID) | ORAL | Status: DC

## 2014-03-12 MED ORDER — LORAZEPAM 1 MG PO TABS: 0.0000 mg | ORAL_TABLET | Freq: Four times a day (QID) | ORAL | Status: DC

## 2014-03-12 MED ORDER — CHLORDIAZEPOXIDE HCL 25 MG PO CAPS
25.0000 mg | ORAL_CAPSULE | Freq: Once | ORAL | Status: AC
  Administered 2014-03-12: 25 mg via ORAL

## 2014-03-12 MED ORDER — DOXEPIN HCL 25 MG PO CAPS
25.0000 mg | ORAL_CAPSULE | Freq: Every day | ORAL | Status: DC
  Administered 2014-03-12: 25 mg via ORAL
  Filled 2014-03-12 (×3): qty 1

## 2014-03-12 MED ORDER — VITAMIN B-1 100 MG PO TABS: 100.0000 mg | ORAL_TABLET | Freq: Every day | ORAL | Status: DC

## 2014-03-12 MED ORDER — ADULT MULTIVITAMIN W/MINERALS CH: 1.0000 | ORAL_TABLET | Freq: Every day | ORAL | Status: DC

## 2014-03-12 MED ORDER — POTASSIUM CHLORIDE CRYS ER 20 MEQ PO TBCR
40.0000 meq | EXTENDED_RELEASE_TABLET | Freq: Once | ORAL | Status: AC
  Administered 2014-03-12: 40 meq via ORAL
  Filled 2014-03-12: qty 2

## 2014-03-12 MED ORDER — CHLORDIAZEPOXIDE HCL 25 MG PO CAPS: 25.0000 mg | ORAL_CAPSULE | Freq: Three times a day (TID) | ORAL | Status: DC

## 2014-03-12 MED ORDER — CHLORDIAZEPOXIDE HCL 25 MG PO CAPS: 25.0000 mg | ORAL_CAPSULE | Freq: Four times a day (QID) | ORAL | Status: DC

## 2014-03-12 MED ORDER — VITAMIN B-1 100 MG PO TABS
100.0000 mg | ORAL_TABLET | Freq: Every day | ORAL | Status: DC
  Filled 2014-03-12 (×3): qty 1

## 2014-03-12 MED ORDER — VITAMIN B-1 100 MG PO TABS
100.0000 mg | ORAL_TABLET | Freq: Every day | ORAL | Status: DC
  Filled 2014-03-12 (×2): qty 1

## 2014-03-12 MED ORDER — ADULT MULTIVITAMIN W/MINERALS CH
1.0000 | ORAL_TABLET | Freq: Every day | ORAL | Status: DC
  Administered 2014-03-12 – 2014-03-13 (×2): 1 via ORAL
  Filled 2014-03-12 (×5): qty 1

## 2014-03-12 MED ORDER — TRAZODONE HCL 50 MG PO TABS
50.0000 mg | ORAL_TABLET | Freq: Every evening | ORAL | Status: DC | PRN
  Filled 2014-03-12 (×2): qty 1

## 2014-03-12 MED ORDER — ONDANSETRON 4 MG PO TBDP
4.0000 mg | ORAL_TABLET | Freq: Four times a day (QID) | ORAL | Status: DC | PRN
  Administered 2014-03-12 – 2014-03-13 (×3): 4 mg via ORAL
  Filled 2014-03-12 (×3): qty 1

## 2014-03-12 MED ORDER — ONDANSETRON 4 MG PO TBDP: 4.0000 mg | ORAL_TABLET | Freq: Four times a day (QID) | ORAL | Status: DC | PRN

## 2014-03-12 MED ORDER — ONDANSETRON HCL 4 MG PO TABS: 4.0000 mg | ORAL_TABLET | Freq: Three times a day (TID) | ORAL | Status: DC | PRN

## 2014-03-12 MED ORDER — SODIUM CHLORIDE 0.9 % IV BOLUS (SEPSIS)
1000.0000 mL | Freq: Once | INTRAVENOUS | Status: AC
  Administered 2014-03-12: 1000 mL via INTRAVENOUS

## 2014-03-12 MED ORDER — IBUPROFEN 400 MG PO TABS: 600.0000 mg | ORAL_TABLET | Freq: Three times a day (TID) | ORAL | Status: DC | PRN

## 2014-03-12 MED ORDER — ZOLPIDEM TARTRATE 5 MG PO TABS: 5.0000 mg | ORAL_TABLET | Freq: Every evening | ORAL | Status: DC | PRN

## 2014-03-12 MED ORDER — LORAZEPAM 1 MG PO TABS: 1.0000 mg | ORAL_TABLET | Freq: Three times a day (TID) | ORAL | Status: DC | PRN

## 2014-03-12 NOTE — ED Notes (Signed)
PT BELONGINGS (INCLUDING CELLPHONE) SENT WITH PT TO [facility]IA [facility]

## 2014-03-12 NOTE — ED Notes (Signed)
Pt talking to telepscyh.

## 2014-03-12 NOTE — Progress Notes (Signed)
Patient ID: Kirsten GrayerJessica A Candelaria, female   DOB: 05/23/1981, 33 y.o.   MRN: [id number] Pt alert, oriented and cooperative.  Denies SI/HI  Denies A/[employer] contracts for safety. Reports feeling anxious, shaky and depressed. Interactive with staff. C/o N/V Medication given as ordered. See MAR. Visitation with visitor for 15 min this evening. Will contine to monitor closely and evaluate for stabilization.

## 2014-03-12 NOTE — ED Notes (Signed)
Pt. presents with family requesting detox for alcohol abuse and Cocaine/Vicodin abuse , last ETOH today , denies suicidal ideation .

## 2014-03-12 NOTE — ED Notes (Signed)
Breakfast delivered  

## 2014-03-12 NOTE — ED Notes (Signed)
Patient attempting to drink sprite, will see how patient tolerates then will try potassium.
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
## 2014-03-12 NOTE — Discharge Instructions (Signed)
Follow through with the referrals given to you by TTS.  Alcohol Intoxication Alcohol intoxication occurs when the amount of alcohol that a person has consumed impairs his or her ability to mentally and physically function. Alcohol directly impairs the normal chemical activity of the brain. Drinking large amounts of alcohol can lead to changes in mental function and behavior, and it can cause many physical effects that can be harmful.  Alcohol intoxication can range in severity from mild to very severe. Various factors can affect the level of intoxication that occurs, such as the person's age, gender, weight, frequency of alcohol consumption, and the presence of other medical conditions (such as diabetes, seizures, or heart conditions). Dangerous levels of alcohol intoxication may occur when people drink large amounts of alcohol in a short period (binge drinking). Alcohol can also be especially dangerous when combined with certain prescription medicines or "recreational" drugs. SIGNS AND SYMPTOMS Some common signs and symptoms of mild alcohol intoxication include:  Loss of coordination.  Changes in mood and behavior.  Impaired judgment.  Slurred speech. As alcohol intoxication progresses to more severe levels, other signs and symptoms will appear. These may include:  Vomiting.  Confusion and impaired memory.  Slowed breathing.  Seizures.  Loss of consciousness. DIAGNOSIS  Your health care provider will take a medical history and perform a physical exam. You will be asked about the amount and type of alcohol you have consumed. Blood tests will be done to measure the concentration of alcohol in your blood. In many places, your blood alcohol level must be lower than 80 mg/dL (4.09%0.08%) to legally drive. However, many dangerous effects of alcohol can occur at much lower levels.  TREATMENT  People with alcohol intoxication often do not require treatment. Most of the effects of alcohol  intoxication are temporary, and they go away as the alcohol naturally leaves the body. Your health care provider will monitor your condition until you are stable enough to go home. Fluids are sometimes given through an IV access tube to help prevent dehydration.  HOME CARE INSTRUCTIONS  Do not drive after drinking alcohol.  Stay hydrated. Drink enough water and fluids to keep your urine clear or pale yellow. Avoid caffeine.   Only take over-the-counter or prescription medicines as directed by your health care provider.  SEEK MEDICAL CARE IF:   You have persistent vomiting.   You do not feel better after a few days.  You have frequent alcohol intoxication. Your health care provider can help determine if you should see a substance use treatment counselor. SEEK IMMEDIATE MEDICAL CARE IF:   You become shaky or tremble when you try to stop drinking.   You shake uncontrollably (seizure).   You throw up (vomit) blood. This may be bright red or may look like black coffee grounds.   You have blood in your stool. This may be bright red or may appear as a black, tarry, bad smelling stool.   You become lightheaded or faint.  MAKE SURE YOU:   Understand these instructions.  Will watch your condition.  Will get help right away if you are not doing well or get worse. Document Released: 05/05/2005 Document Revised: 03/28/2013 Document Reviewed: 12/29/2012 [facility] Patient Information 2015 [place]. This information is not intended to replace advice given to you by your health care provider. Make sure you discuss any questions you have with your health care provider.  Depression Depression refers to feeling sad, low, down in the dumps, blue, gloomy, or empty.  In general, there are two kinds of depression: 1. Normal sadness or normal grief. This kind of depression is one that we all feel from time to time after upsetting life experiences, such as the loss of a job or the ending of  a relationship. This kind of depression is considered normal, is short lived, and resolves within a few days to 2 weeks. Depression experienced after the loss of a loved one (bereavement) often lasts longer than 2 weeks but normally gets better with time. 2. Clinical depression. This kind of depression lasts longer than normal sadness or normal grief or interferes with your ability to function at home, at work, and in school. It also interferes with your personal relationships. It affects almost every aspect of your life. Clinical depression is an illness. Symptoms of depression can also be caused by conditions other than those mentioned above, such as:  Physical illness. Some physical illnesses, including underactive thyroid gland (hypothyroidism), severe anemia, specific types of cancer, diabetes, uncontrolled seizures, heart and lung problems, strokes, and chronic pain are commonly associated with symptoms of depression.  Side effects of some prescription medicine. In some people, certain types of medicine can cause symptoms of depression.  Substance abuse. Abuse of alcohol and illicit drugs can cause symptoms of depression. SYMPTOMS Symptoms of normal sadness and normal grief include the following:  Feeling sad or crying for short periods of time.  Not caring about anything (apathy).  Difficulty sleeping or sleeping too much.  No longer able to enjoy the things you used to enjoy.  Desire to be by oneself all the time (social isolation).  Lack of energy or motivation.  Difficulty concentrating or remembering.  Change in appetite or weight.  Restlessness or agitation. Symptoms of clinical depression include the same symptoms of normal sadness or normal grief and also the following symptoms:  Feeling sad or crying all the time.  Feelings of guilt or worthlessness.  Feelings of hopelessness or helplessness.  Thoughts of suicide or the desire to harm yourself (suicidal  ideation).  Loss of touch with reality (psychotic symptoms). Seeing or hearing things that are not real (hallucinations) or having false beliefs about your life or the people around you (delusions and paranoia). DIAGNOSIS  The diagnosis of clinical depression is usually based on how bad the symptoms are and how long they have lasted. Your health care provider will also ask you questions about your medical history and substance use to find out if physical illness, use of prescription medicine, or substance abuse is causing your depression. Your health care provider may also order blood tests. TREATMENT  Often, normal sadness and normal grief do not require treatment. However, sometimes antidepressant medicine is given for bereavement to ease the depressive symptoms until they resolve. The treatment for clinical depression depends on how bad the symptoms are but often includes antidepressant medicine, counseling with a mental health professional, or both. Your health care provider will help to determine what treatment is best for you. Depression caused by physical illness usually goes away with appropriate medical treatment of the illness. If prescription medicine is causing depression, talk with your health care provider about stopping the medicine, decreasing the dose, or changing to another medicine. Depression caused by the abuse of alcohol or illicit drugs goes away when you stop using these substances. Some adults need professional help in order to stop drinking or using drugs. SEEK IMMEDIATE MEDICAL CARE IF:  You have thoughts about hurting yourself or others.  You lose  touch with reality (have psychotic symptoms).  You are taking medicine for depression and have a serious side effect. FOR MORE INFORMATION  National Alliance on Mental Illness: www.nami.[employer] of Mental Health: http://www.maynard.net/ Document Released: 07/23/2000 Document Revised: 12/10/2013 Document Reviewed:  10/25/2011 [facility] Patient Information 2015 [place], [place]. This information is not intended to replace advice given to you by your health care provider. Make sure you discuss any questions you have with your health care provider.  Lorazepam tablets What is this medicine? LORAZEPAM (lor A ze pam) is a benzodiazepine. It is used to treat anxiety. This medicine may be used for other purposes; ask your health care provider or pharmacist if you have questions. COMMON BRAND NAME(S): Ativan What should I tell my health care provider before I take this medicine? They need to know if you have any of these conditions: -alcohol or drug abuse problem -bipolar disorder, depression, psychosis or other mental health condition -glaucoma -kidney or liver disease -lung disease or breathing difficulties -myasthenia gravis -Parkinson's disease -seizures or a history of seizures -suicidal thoughts -an unusual or allergic reaction to lorazepam, other benzodiazepines, foods, dyes, or preservatives -pregnant or trying to get pregnant -breast-feeding How should I use this medicine? Take this medicine by mouth with a glass of water. Follow the directions on the prescription label. If it upsets your stomach, take it with food or milk. Take your medicine at regular intervals. Do not take it more often than directed. Do not stop taking except on the advice of your doctor or health care professional. Talk to your pediatrician regarding the use of this medicine in children. Special care may be needed. Overdosage: If you think you have taken too much of this medicine contact a poison control center or emergency room at once. NOTE: This medicine is only for you. Do not share this medicine with others. What if I miss a dose? If you miss a dose, take it as soon as you can. If it is almost time for your next dose, take only that dose. Do not take double or extra doses. What may interact with this medicine? -barbiturate  medicines for inducing sleep or treating seizures, like phenobarbital -clozapine -medicines for depression, mental problems or psychiatric disturbances -medicines for sleep -phenytoin -probenecid -theophylline -valproic acid This list may not describe all possible interactions. Give your health care provider a list of all the medicines, herbs, non-prescription drugs, or dietary supplements you use. Also tell them if you smoke, drink alcohol, or use illegal drugs. Some items may interact with your medicine. What should I watch for while using this medicine? Visit your doctor or health care professional for regular checks on your progress. Your body may become dependent on this medicine, ask your doctor or health care professional if you still need to take it. However, if you have been taking this medicine regularly for some time, do not suddenly stop taking it. You must gradually reduce the dose or you may get severe side effects. Ask your doctor or health care professional for advice before increasing or decreasing the dose. Even after you stop taking this medicine it can still affect your body for several days. You may get drowsy or dizzy. Do not drive, use machinery, or do anything that needs mental alertness until you know how this medicine affects you. To reduce the risk of dizzy and fainting spells, do not stand or sit up quickly, especially if you are an older patient. Alcohol may increase dizziness and drowsiness.  Avoid alcoholic drinks. Do not treat yourself for coughs, colds or allergies without asking your doctor or health care professional for advice. Some ingredients can increase possible side effects. What side effects may I notice from receiving this medicine? Side effects that you should report to your doctor or health care professional as soon as possible: -changes in vision -confusion -depression -mood changes, excitability or aggressive behavior -movement difficulty, staggering  or jerky movements -muscle cramps -restlessness -weakness or tiredness Side effects that usually do not require medical attention (report to your doctor or health care professional if they continue or are bothersome): -constipation or diarrhea -difficulty sleeping, nightmares -dizziness, drowsiness -headache -nausea, vomiting This list may not describe all possible side effects. Call your doctor for medical advice about side effects. You may report side effects to FDA at 1-800-FDA-1088. Where should I keep my medicine? Keep out of the reach of children. This medicine can be abused. Keep your medicine in a safe place to protect it from theft. Do not share this medicine with anyone. Selling or giving away this medicine is dangerous and against the law. Store at room temperature between 20 and 25 degrees C (68 and 77 degrees F). Protect from light. Keep container tightly closed. Throw away any unused medicine after the expiration date. NOTE: This sheet is a summary. It may not cover all possible information. If you have questions about this medicine, talk to your doctor, pharmacist, or health care provider.  2015, [employer] Standard. (2008-01-26 14:58:20)

## 2014-03-12 NOTE — Progress Notes (Signed)
Patient ID: Kirsten GrayerJessica A [name], female   DOB: 1980/08/28, 33 y.o.   MRN: [id number] [age] year old female admitted for alcohol detox.  She has never gone through a hospital detox before.  She was brought to the ED by her mother.  Patient states she had a recent breakup with her fiance and has increased her drinking to about a fifth of liquor per day and some wine.  States she drinks till she passes out and when she wakes up she drinks some more.  She has two children living in the home with her, ages 585 and 2413.  The 35 year old is with her mother and the 35 year old is with the biological father.  She is very anxious and sad and showing physical signs and symptoms of alcohol detox such as sweating, shaking and fidgety.  Patient was oriented to the observation unit and offered food and fluids.  She asked for some ginger ale and crackers which she received.  She is currently resting.
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
## 2014-03-12 NOTE — ED Notes (Signed)
PT HAS LEFT TO GO TO [facility] WITH [facility]
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
## 2014-03-12 NOTE — ED Notes (Signed)
[facility] HE ADVISES THEY ANTICIPATE DISCHARGES THIS AFTERNOON SO [facility],.
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
## 2014-03-12 NOTE — ED Notes (Signed)
[facility] CALLED TO [facility]SPORT 

## 2014-03-12 NOTE — ED Notes (Signed)
[name] AND MADE HIM AWARE THAT PT IS REQUESTING INPATIENT DETOX AT THIS TIME

## 2014-03-13 DIAGNOSIS — F1010 Alcohol abuse, uncomplicated: Secondary | ICD-10-CM

## 2014-03-13 DIAGNOSIS — F1994 Other psychoactive substance use, unspecified with psychoactive substance-induced mood disorder: Secondary | ICD-10-CM

## 2014-03-13 DIAGNOSIS — F332 Major depressive disorder, recurrent severe without psychotic features: Secondary | ICD-10-CM

## 2014-03-13 NOTE — Progress Notes (Signed)
Patient ID: Kirsten GrayerJessica A [name], female   DOB: [birth year], 33 y.o.   MRN: [id number] D-On awakening this am at 9am complained of nausea.Took Librium but wanted to wait to take her vitamins after first taking a prn Zofran. Waited also to wait on her breakfast and never did eat it. Is drinking fluids. Lunch was fried chicken, offered her other snacks available but declined. Ate some of the chicken and a few minutes later in the bathroom and once out of the bathroom states she had a diarrhea stool and threw up some liquids. Gave her Immodium for diarhea but too soon to repeat the Zofran, which she had this am about 845.Flat affect, minimally verbal. A-Medications as ordered and needed. Declined her am vitamins due to nausea. Support offered. Monitored for safety. R-Sleeping much of shift. CIWA 4.

## 2014-03-13 NOTE — Progress Notes (Signed)
Patient ID: Kirsten GrayerJessica A [name], female   DOB: 03-Feb-1981, 33 y.o.   MRN: [id number] Has reported two diarrhea stools that were un witnessed. She was given two doses of Imodium for this complaint. Resting but not asleep. She is planning for discharge today to home with outpatient referrals, she states she would like to be at home with her children. Waiting on the extender or Dr.to come in and make recommendations..Marland Kitchen

## 2014-03-13 NOTE — Plan of Care (Addendum)
[facility] Observation Crisis Plan  Reason for Crisis Plan:  Substance Abuse   Plan of Care:  Referral for IOP  Family Support:    Mother, father, step-father  Current Living Environment:  Living Arrangements: Children (ages 35 y/o and 35 y/o)  Insurance:  Self Pay Hospital Account   Name Acct ID Class Status Primary Coverage   Kirsten [name], Kirsten [name] [id number] BEHAVIORAL HEALTH OBSERVATION Open None        Guarantor Account (for Hospital Account [id number])   Name Relation to Pt Service Area Active? Acct Type   Kirsten [name], [facility]irsten [name] [facility]   Address Phone       [address]  [place], [place] [address] [phone])          Coverage Information (for Hospital Account [id number])   Not on file      Legal Guardian:   Self  Primary Care Provider:  Johny BlamerHARRIS, WILLIAM, [name]  Current Outpatient Providers:  None  Psychiatrist:   None  Counselor/Therapist:   None  Compliant with Medications:  Yes; pt only takes [name] multivitamin  Additional Information: After consulting with Kirsten RoutArchana Kumar, [name] and Kirsten Headonrad Withrow, NP it has been determined that pt does not present [name] life threatening danger to herself or others, and that psychiatric hospitalization is not indicated for her at this time.  She would, however, benefit from outpatient treatment for mental health and substance abuse problems.  She is not certain whether she can commit to CD-IOP at this time,  Her discharge instructions will include contact information for [facility] at [facility] and at [facility], as well as routine service providers such as [facility] and [facility].  She will follow up on her own.  Pt has also requested contact information for local Alcoholics Anonymous meetings, which will be printed out and provided.  Kirsten Canninghomas Jonah Nestle, MA Triage Specialist Kirsten GibneyHughes, Rhona Fusilier [name] 8/5/20152:48 PM

## 2014-03-13 NOTE — Progress Notes (Signed)
Patient ID: Kirsten GrayerJessica A [name], female   DOB: 10-Feb-1981, 33 y.o.   MRN: [id number] Discharge Note-Seen by Renata Capriceonrad NP and Dr. Lucianne MussKumar and it was there recommendation that she can be discharged today with a referral to outpatient dept for substance abuse. She is agreeable to this if she can get her parents to watch her two children with their work schedules. She states she is motivated to stop drinking. She has complained of stomach upset much of the shift and has had two doses of Zofran this shift and two doses of Imodium for loose stools. She has had a poor appetite but did take in fluids. She denies any thoughts to hurt self or others now or on admission. She is not psychotic. All property was returned to her. Her mom was called by client to transport her home.She herself is currently unemployed and is supported by her ex husband.She had no behavior problems and left without verbalizing any complaints.Reviewed with her her discharge plans and she was in agreement. She was given the information on both outpatient and IOP services dependent on the arrangements she can make for childcare. ALso, given the local AA meetings.

## 2014-03-13 NOTE — Discharge Instructions (Signed)
For your ongoing mental health and substance abuse needs, consider the following agencies and programs:  Chemical Dependency Intensive Outpatient Program (CD-IOP) providers:       [facility]      [address]      [place], [place] [address]      [phone]      Contact person: Daneil Dolinnne Evans, LCAS      Program meets Monday-Wednesday-Friday, 1:00 pm - 4:00 pm       [facility]      [address]      [place], [place] [address]      [phone]      Program meets Monday-Wednesday-Friday, 9:00 am - 12:00 pm  Agencies that provide psychiatry and counseling by scheduled appointment:       [facility]      [address]       [place], [place] [address]      [phone]       [facility]      [address]      [place], [place] [address]      [phone]  For Alcoholics Anonymous meetings in this area, refer to the printed information provided by the Observation Unit staff.
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
## 2014-03-14 NOTE — Discharge Summary (Signed)
Patient seen, evaluated by me. Patient discharged with outpatient referral for substance abuse. Patient is not at risk of hurting herself or others and is back to her baseline

## 2014-03-14 NOTE — H&P (Signed)
Patient seen, evaluated by me. Patient needs monitoring in OBS

## 2014-05-24 ENCOUNTER — Emergency Department (HOSPITAL_BASED_OUTPATIENT_CLINIC_OR_DEPARTMENT_OTHER)
Admission: EM | Admit: 2014-05-24 | Discharge: 2014-05-24 | Disposition: A | Discharge status: Home / Self Care | Payer: Self-pay | Attending: Emergency Medicine | Admitting: Emergency Medicine

## 2014-05-24 ENCOUNTER — Encounter (HOSPITAL_BASED_OUTPATIENT_CLINIC_OR_DEPARTMENT_OTHER): Payer: Self-pay | Admitting: Emergency Medicine

## 2014-05-24 DIAGNOSIS — Z8659 Personal history of other mental and behavioral disorders: Secondary | ICD-10-CM | POA: Insufficient documentation

## 2014-05-24 DIAGNOSIS — L02413 Cutaneous abscess of right upper limb: Secondary | ICD-10-CM

## 2014-05-24 DIAGNOSIS — L03113 Cellulitis of right upper limb: Secondary | ICD-10-CM | POA: Insufficient documentation

## 2014-05-24 DIAGNOSIS — Z720 Tobacco use: Secondary | ICD-10-CM | POA: Insufficient documentation

## 2014-05-24 MED ORDER — OXYCODONE-ACETAMINOPHEN 5-325 MG PO TABS
1.0000 | ORAL_TABLET | Freq: Once | ORAL | Status: AC
  Administered 2014-05-24: 1 via ORAL
  Filled 2014-05-24: qty 1

## 2014-05-24 MED ORDER — CLINDAMYCIN HCL 150 MG PO CAPS: 300.0000 mg | ORAL_CAPSULE | Freq: Four times a day (QID) | ORAL | Status: AC

## 2014-05-24 MED ORDER — OXYCODONE-ACETAMINOPHEN 5-325 MG PO TABS: 1.0000 | ORAL_TABLET | Freq: Four times a day (QID) | ORAL | Status: DC | PRN

## 2014-05-24 MED ORDER — CLINDAMYCIN HCL 150 MG PO CAPS
300.0000 mg | ORAL_CAPSULE | Freq: Once | ORAL | Status: AC
  Administered 2014-05-24: 300 mg via ORAL
  Filled 2014-05-24: qty 2

## 2014-05-24 NOTE — ED Notes (Signed)
Pt ambulatory to hallway bed.

## 2014-05-24 NOTE — ED Notes (Signed)
Pt amb to triage with quick steady gait in nad. Pt reports "bump" to her right elbow x 2 days.

## 2014-05-24 NOTE — ED Provider Notes (Signed)
CSN: [id number]     Arrival date & time 05/24/14  [id number] History   First MD Initiated Contact with Patient 05/24/14 1055     Chief Complaint  Patient presents with  . bump on elbow      (Consider location/radiation/quality/duration/timing/severity/associated sxs/prior Treatment) HPI Pt presenting with c/o pain and bump over right elbow.  Pt states that she noted pain and redness over the past 2 days.  She has had no fever/chills.  No pain with ROM of elbow.  No pus draining.  No hx of MRSA in the past.  She has not had any treatment for this prior to arrival.  Pain is worse with palpation,. There are no other associated systemic symptoms, there are no other alleviating or modifying factors.   Past Medical History  Diagnosis Date  . Anxiety   . Alcohol abuse   . Polysubstance abuse    Past Surgical History  Procedure Laterality Date  . Tonsillectomy     Family History  Problem Relation Age of Onset  . Diabetes Neg Hx   . Heart attack Neg Hx   . Hyperlipidemia Neg Hx   . Hypertension Neg Hx   . Sudden death Neg Hx    History  Substance Use Topics  . Smoking status: Current Some Day Smoker -- 1.00 packs/day for 15 years    Types: Cigarettes  . Smokeless tobacco: Not on file  . Alcohol Use: 0.0 oz/week     Comment: Approximatly 1/5 of liquor per day and wine also   OB History   Grav Para Term Preterm Abortions TAB SAB Ect Mult Living   6 2 2  2           Review of Systems ROS reviewed and all otherwise negative except for mentioned in HPI    Allergies  Bee venom  Home Medications   Prior to Admission medications   Medication Sig Start Date End Date Taking? Authorizing Provider  clindamycin (CLEOCIN) 150 MG capsule Take 2 capsules (300 mg total) by mouth every 6 (six) hours. 05/24/14   [name], MD  oxyCODONE-acetaminophen (PERCOCET) 5-325 MG per tablet Take 2 tablets by mouth every 4 (four) hours as needed. 05/26/14   Hurman HornJohn M Bednar, MD   oxyCODONE-acetaminophen (PERCOCET/ROXICET) 5-325 MG per tablet Take 1-2 tablets by mouth every 6 (six) hours as needed for severe pain. 05/24/14   [name], MD   BP 113/46  Pulse 74  Temp(Src) 97.9 F (36.6 C) (Oral)  Resp 18  SpO2 100%  LMP 05/06/2014 Vitals reviewed Physical Exam Physical Examination: General appearance - alert, well appearing, and in no distress Mental status - alert, oriented to person, place, and time Eyes - no conjunctival injection, no scleral icterus Chest - clear to auscultation, no wheezes, rales or rhonchi, symmetric air entry Heart - normal rate, regular rhythm, normal S1, S2, no murmurs, rubs, clicks or gallops Neurological - alert, oriented, normal speech, sensation distally intact, strength 5/5 in extremities x 4 Musculoskeletal - no joint tenderness, deformity or swelling Extremities - FROM of right elbow without pain, peripheral pulses normal, no pedal edema, no clubbing or cyanosis Skin - approx 1cm indurated erythematous are on dorsum of distal forearm near elbow- not fluctuant, no drainage  ED Course  Procedures (including critical care time) Labs Review Labs Reviewed - No data to display  Imaging Review No results found.   EKG Interpretation None      MDM   Final diagnoses:  Abscess of  forearm, right    Pt presenting with abscess of right forearm, non fluctuant, nothing to I and D at this time.  Area is near elbow joint but patient has no joint pain with ROM of elbow.  Will treat with po antibitiocs, have asked patietn to return in 48 hours for a recheck.  Discharged with strict return precautions.  Pt agreeable with plan.    [name], MD 05/27/14 [phone]

## 2014-05-24 NOTE — Discharge Instructions (Signed)
Return to the ED with any concerns including fever, vomiting and not able to keep down liquids or antibiotics, increased pain in arm, increased area of redness, pain with movement of elbow, decreased level of alertness/lethargy, or any other alarming symptoms  You should return to the ED in 48 hours to have the area rechecked- be sure to start on your antibiotics today

## 2014-05-24 NOTE — ED Notes (Signed)
Pt resting quietly, awaits md eval.

## 2014-05-26 ENCOUNTER — Encounter (HOSPITAL_BASED_OUTPATIENT_CLINIC_OR_DEPARTMENT_OTHER): Payer: Self-pay | Admitting: Emergency Medicine

## 2014-05-26 ENCOUNTER — Emergency Department (HOSPITAL_BASED_OUTPATIENT_CLINIC_OR_DEPARTMENT_OTHER)
Admission: EM | Admit: 2014-05-26 | Discharge: 2014-05-26 | Disposition: A | Discharge status: Home / Self Care | Payer: Self-pay | Attending: Emergency Medicine | Admitting: Emergency Medicine

## 2014-05-26 DIAGNOSIS — L03113 Cellulitis of right upper limb: Secondary | ICD-10-CM | POA: Insufficient documentation

## 2014-05-26 DIAGNOSIS — Z8659 Personal history of other mental and behavioral disorders: Secondary | ICD-10-CM | POA: Insufficient documentation

## 2014-05-26 DIAGNOSIS — Z791 Long term (current) use of non-steroidal anti-inflammatories (NSAID): Secondary | ICD-10-CM | POA: Insufficient documentation

## 2014-05-26 DIAGNOSIS — Z792 Long term (current) use of antibiotics: Secondary | ICD-10-CM | POA: Insufficient documentation

## 2014-05-26 DIAGNOSIS — Z720 Tobacco use: Secondary | ICD-10-CM | POA: Insufficient documentation

## 2014-05-26 MED ORDER — OXYCODONE-ACETAMINOPHEN 5-325 MG PO TABS: 2.0000 | ORAL_TABLET | ORAL | Status: AC | PRN

## 2014-05-26 NOTE — ED Provider Notes (Signed)
CSN: [id number]     Arrival date & time 05/26/14  1024 History   First MD Initiated Contact with Patient 05/26/14 1058     Chief Complaint  Patient presents with  . Arm Pain     (Consider location/radiation/quality/duration/timing/severity/associated sxs/prior Treatment) HPI 33 year old female being treated the last few days for right forearm cellulitis with clindamycin and Percocet with partial improvement of the pain, she had possible mild fever yesterday with tympanic temperature between 100-101, she is no fever today she has no red streaks up her arm she has no weakness or numbness to the arm she has ongoing severe pain to the right proximal forearm ulnar aspect with slightly more swelling than her last ED visit for the same problem. She has no confusion no vomiting no rash no shortness of breath no other concerns. Her pain is well localized without radiation or associated symptoms her pain is moderately severe worse with palpation and movement. Past Medical History  Diagnosis Date  . Anxiety   . Alcohol abuse   . Polysubstance abuse    Past Surgical History  Procedure Laterality Date  . Tonsillectomy     Family History  Problem Relation Age of Onset  . Diabetes Neg Hx   . Heart attack Neg Hx   . Hyperlipidemia Neg Hx   . Hypertension Neg Hx   . Sudden death Neg Hx    History  Substance Use Topics  . Smoking status: Current Some Day Smoker -- 1.00 packs/day for 15 years    Types: Cigarettes  . Smokeless tobacco: Not on file  . Alcohol Use: 0.0 oz/week     Comment: Approximatly 1/5 of liquor per day and wine also   OB History    Gravida Para Term Preterm AB TAB SAB Ectopic Multiple Living   6 2 2  2           Review of Systems 10 Systems reviewed and are negative for acute change except as noted in the HPI.   Allergies  Bee venom  Home Medications   Prior to Admission medications   Medication Sig Start Date End Date Taking? Authorizing Provider  clindamycin  (CLEOCIN) 150 MG capsule Take 2 capsules (300 mg total) by mouth every 6 (six) hours. 05/24/14   [name], MD  meloxicam (MOBIC) 7.5 MG tablet Take 1 tablet (7.5 mg total) by mouth daily. 06/06/14   Elson AreasLeslie K Sofia, PA-C  oxyCODONE-acetaminophen (PERCOCET) 5-325 MG per tablet Take 2 tablets by mouth every 4 (four) hours as needed. 05/26/14   Hurman HornJohn M Edilberto Roosevelt, MD  oxyCODONE-acetaminophen (PERCOCET/ROXICET) 5-325 MG per tablet Take 1-2 tablets by mouth every 6 (six) hours as needed for severe pain. 06/06/14   Elson AreasLeslie K Sofia, PA-C   BP 103/57 mmHg  Pulse 90  Temp(Src) 98 F (36.7 C) (Oral)  Resp 18  SpO2 100%  LMP 05/06/2014 Physical Exam  Nursing note and vitals reviewed. Constitutional:  Awake, alert, nontoxic appearance.  HENT:  Head: Atraumatic.  Eyes: Right eye exhibits no discharge. Left eye exhibits no discharge.  Neck: Neck supple.  Cardiovascular: Normal rate and regular rhythm.   No murmur heard. Pulmonary/Chest: Effort normal and breath sounds normal. No respiratory distress. She has no wheezes. She has no rales. She exhibits no tenderness.  Pulse oximetry normal room air 100%  Abdominal: Soft. There is no tenderness. There is no rebound.  Musculoskeletal: She exhibits tenderness.  Baseline ROM, no obvious new focal weakness. Right arm has no tenderness to the  shoulder wrist or hand and the right elbow joint itself also appears nontender but near her left although the ulnar aspect near the olecranon she has localized cellulitis with localized mild swelling tenderness mildly increased warmth minimal erythema with no focal subcutaneous fluid collection noted with POCUS; her right arm has normal light touch capillary refill less than 2 seconds with intact strength and light touch in the distributions of the median radial and ulnar nerve function; other than several centimeters of localized soft tissue tenderness to her proximal forearm the rest of her forearm is not swollen or  tender. Her upper arm is nontender or swollen. There is no evidence of lymphangitis.  Neurological: She is alert.  Mental status and motor strength appears baseline for patient and situation.  Skin: No rash noted.  Psychiatric: She has a normal mood and affect.    ED Course  Procedures (including critical care time) Labs Review Labs Reviewed - No data to display  Imaging Review No results found.   EKG Interpretation None      MDM   Final diagnoses:  Cellulitis of right forearm    Patient informed of clinical course, understand medical decision-making process, and agree with plan. I doubt any other EMC precluding discharge at this time including, but not necessarily limited to the following:abscess, nec. fasc.   Hurman HornJohn M Faryal Marxen, MD 06/09/14 2059

## 2014-05-26 NOTE — ED Notes (Addendum)
Patient was seen 2 days ago for infection in arm. Given abx and pain medication, states that it is not helping much. There is about a quarter sized hardened and red with an area of swelling and redness radiating appx 3 inches around the site.

## 2014-05-30 ENCOUNTER — Encounter (HOSPITAL_BASED_OUTPATIENT_CLINIC_OR_DEPARTMENT_OTHER): Payer: Self-pay | Admitting: Emergency Medicine

## 2014-05-30 ENCOUNTER — Emergency Department (HOSPITAL_BASED_OUTPATIENT_CLINIC_OR_DEPARTMENT_OTHER)
Admission: EM | Admit: 2014-05-30 | Discharge: 2014-05-30 | Disposition: A | Discharge status: Home / Self Care | Payer: Self-pay | Attending: Emergency Medicine | Admitting: Emergency Medicine

## 2014-05-30 DIAGNOSIS — Z792 Long term (current) use of antibiotics: Secondary | ICD-10-CM | POA: Insufficient documentation

## 2014-05-30 DIAGNOSIS — Z8659 Personal history of other mental and behavioral disorders: Secondary | ICD-10-CM | POA: Insufficient documentation

## 2014-05-30 DIAGNOSIS — L02413 Cutaneous abscess of right upper limb: Secondary | ICD-10-CM | POA: Insufficient documentation

## 2014-05-30 DIAGNOSIS — Z79899 Other long term (current) drug therapy: Secondary | ICD-10-CM | POA: Insufficient documentation

## 2014-05-30 DIAGNOSIS — Z720 Tobacco use: Secondary | ICD-10-CM | POA: Insufficient documentation

## 2014-05-30 MED ORDER — SULFAMETHOXAZOLE-TRIMETHOPRIM 800-160 MG PO TABS: 1.0000 | ORAL_TABLET | Freq: Two times a day (BID) | ORAL | Status: AC

## 2014-05-30 NOTE — Discharge Instructions (Signed)
Apply warm soaks to the area several times daily for the next several days.  Stop your clindamycin and start taking Bactrim as prescribed.   Abscess An abscess is an infected area that contains a collection of pus and debris.It can occur in almost any part of the body. An abscess is also known as a furuncle or boil. CAUSES  An abscess occurs when tissue gets infected. This can occur from blockage of oil or sweat glands, infection of hair follicles, or a minor injury to the skin. As the body tries to fight the infection, pus collects in the area and creates pressure under the skin. This pressure causes pain. People with weakened immune systems have difficulty fighting infections and get certain abscesses more often.  SYMPTOMS Usually an abscess develops on the skin and becomes a painful mass that is red, warm, and tender. If the abscess forms under the skin, you may feel a moveable soft area under the skin. Some abscesses break open (rupture) on their own, but most will continue to get worse without care. The infection can spread deeper into the body and eventually into the bloodstream, causing you to feel ill.  DIAGNOSIS  Your caregiver will take your medical history and perform a physical exam. A sample of fluid may also be taken from the abscess to determine what is causing your infection. TREATMENT  Your caregiver may prescribe antibiotic medicines to fight the infection. However, taking antibiotics alone usually does not cure an abscess. Your caregiver may need to make a small cut (incision) in the abscess to drain the pus. In some cases, gauze is packed into the abscess to reduce pain and to continue draining the area. HOME CARE INSTRUCTIONS   Only take over-the-counter or prescription medicines for pain, discomfort, or fever as directed by your caregiver.  If you were prescribed antibiotics, take them as directed. Finish them even if you start to feel better.  If gauze is used, follow  your caregiver's directions for changing the gauze.  To avoid spreading the infection:  Keep your draining abscess covered with a bandage.  Wash your hands well.  Do not share personal care items, towels, or whirlpools with others.  Avoid skin contact with others.  Keep your skin and clothes clean around the abscess.  Keep all follow-up appointments as directed by your caregiver. SEEK MEDICAL CARE IF:   You have increased pain, swelling, redness, fluid drainage, or bleeding.  You have muscle aches, chills, or a general ill feeling.  You have a fever. MAKE SURE YOU:   Understand these instructions.  Will watch your condition.  Will get help right away if you are not doing well or get worse. Document Released: 05/05/2005 Document Revised: 01/25/2012 Document Reviewed: 10/08/2011 [employer] Patient Information 2015 [place], [place]. This information is not intended to replace advice given to you by your health care provider. Make sure you discuss any questions you have with your health care provider.  Abscess An abscess is an infected area that contains a collection of pus and debris.It can occur in almost any part of the body. An abscess is also known as a furuncle or boil. CAUSES  An abscess occurs when tissue gets infected. This can occur from blockage of oil or sweat glands, infection of hair follicles, or a minor injury to the skin. As the body tries to fight the infection, pus collects in the area and creates pressure under the skin. This pressure causes pain. People with weakened immune systems have  difficulty fighting infections and get certain abscesses more often.  SYMPTOMS Usually an abscess develops on the skin and becomes a painful mass that is red, warm, and tender. If the abscess forms under the skin, you may feel a moveable soft area under the skin. Some abscesses break open (rupture) on their own, but most will continue to get worse without care. The infection can  spread deeper into the body and eventually into the bloodstream, causing you to feel ill.  DIAGNOSIS  Your caregiver will take your medical history and perform a physical exam. A sample of fluid may also be taken from the abscess to determine what is causing your infection. TREATMENT  Your caregiver may prescribe antibiotic medicines to fight the infection. However, taking antibiotics alone usually does not cure an abscess. Your caregiver may need to make a small cut (incision) in the abscess to drain the pus. In some cases, gauze is packed into the abscess to reduce pain and to continue draining the area. HOME CARE INSTRUCTIONS   Only take over-the-counter or prescription medicines for pain, discomfort, or fever as directed by your caregiver.  If you were prescribed antibiotics, take them as directed. Finish them even if you start to feel better.  If gauze is used, follow your caregiver's directions for changing the gauze.  To avoid spreading the infection:  Keep your draining abscess covered with a bandage.  Wash your hands well.  Do not share personal care items, towels, or whirlpools with others.  Avoid skin contact with others.  Keep your skin and clothes clean around the abscess.  Keep all follow-up appointments as directed by your caregiver. SEEK MEDICAL CARE IF:   You have increased pain, swelling, redness, fluid drainage, or bleeding.  You have muscle aches, chills, or a general ill feeling.  You have a fever. MAKE SURE YOU:   Understand these instructions.  Will watch your condition.  Will get help right away if you are not doing well or get worse. Document Released: 05/05/2005 Document Revised: 01/25/2012 Document Reviewed: 10/08/2011 [facility] Patient Information 2015 [place], [place]. This information is not intended to replace advice given to you by your health care provider. Make sure you discuss any questions you have with your health care provider.

## 2014-05-30 NOTE — ED Notes (Signed)
MD at bedside. 

## 2014-05-30 NOTE — ED Notes (Signed)
Pt reports continued pain since last visit. Sts it looked "blue" last night. Reports pain shooting up and down arm

## 2014-05-31 NOTE — ED Provider Notes (Signed)
CSN: [id number]     Arrival date & time 05/30/14  1019 History   First MD Initiated Contact with Patient 05/30/14 1045     Chief Complaint  Patient presents with  . Wound Check     (Consider location/radiation/quality/duration/timing/severity/associated sxs/prior Treatment) HPI Comments: Patient is a 33 year old female who presents with complaints of right elbow pain and swelling. She was seen several days ago and started on clindamycin for what was believed to be a small abscess. This was evaluated with ultrasound and no pus was identified that was amenable to incision and drainage. She returns today with continued discomfort and does not feel as though this is improving.  Patient is a 33 y.o. female presenting with wound check. The history is provided by the patient.  Wound Check This is a new problem. The current episode started more than 1 week ago. The problem occurs constantly. The problem has not changed since onset.Nothing aggravates the symptoms. Nothing relieves the symptoms. She has tried nothing for the symptoms. The treatment provided no relief.    Past Medical History  Diagnosis Date  . Anxiety   . Alcohol abuse   . Polysubstance abuse    Past Surgical History  Procedure Laterality Date  . Tonsillectomy     Family History  Problem Relation Age of Onset  . Diabetes Neg Hx   . Heart attack Neg Hx   . Hyperlipidemia Neg Hx   . Hypertension Neg Hx   . Sudden death Neg Hx    History  Substance Use Topics  . Smoking status: Current Some Day Smoker -- 1.00 packs/day for 15 years    Types: Cigarettes  . Smokeless tobacco: Not on file  . Alcohol Use: 0.0 oz/week     Comment: Approximatly 1/5 of liquor per day and wine also   OB History   Grav Para Term Preterm Abortions TAB SAB Ect Mult Living   6 2 2  2           Review of Systems  All other systems reviewed and are negative.     Allergies  Bee venom  Home Medications   Prior to Admission medications    Medication Sig Start Date End Date Taking? Authorizing Provider  clindamycin (CLEOCIN) 150 MG capsule Take 2 capsules (300 mg total) by mouth every 6 (six) hours. 05/24/14   [name], MD  oxyCODONE-acetaminophen (PERCOCET) 5-325 MG per tablet Take 2 tablets by mouth every 4 (four) hours as needed. 05/26/14   Hurman HornJohn M Bednar, MD  oxyCODONE-acetaminophen (PERCOCET/ROXICET) 5-325 MG per tablet Take 1-2 tablets by mouth every 6 (six) hours as needed for severe pain. 05/24/14   [name], MD  sulfamethoxazole-trimethoprim (BACTRIM DS,SEPTRA DS) 800-160 MG per tablet Take 1 tablet by mouth 2 (two) times daily. 05/30/14 06/06/14  Geoffery Lyonsouglas Eyvette Cordon, MD   BP 100/59  Pulse 72  Temp(Src) 97.7 F (36.5 C) (Oral)  Resp 16  Ht 5\' 4"  (1.626 m)  Wt 130 lb (58.968 kg)  BMI 22.30 kg/m2  SpO2 100%  LMP 05/06/2014 Physical Exam  Nursing note and vitals reviewed. Constitutional: She is oriented to person, place, and time. She appears well-developed and well-nourished. No distress.  HENT:  Head: Normocephalic and atraumatic.  Neck: Normal range of motion. Neck supple.  Musculoskeletal: Normal range of motion.  There is a small, less than 1 cm, mildly erythematous area to the lateral aspect of the right elbow. There is no fluctuance palpable. There is no streaking extending from  the lesion. She has full range of motion of her right elbow without significant discomfort.  Neurological: She is alert and oriented to person, place, and time.  Skin: Skin is warm and dry. She is not diaphoretic.    ED Course  Procedures (including critical care time) Labs Review Labs Reviewed - No data to display  Imaging Review No results found.   EKG Interpretation None      MDM   Final diagnoses:  Abscess of right arm    Will change antibiotics from clindamycin to Bactrim and return as needed if symptoms worsen.    Geoffery Lyonsouglas Jazman Reuter, MD 05/31/14 [phone]

## 2014-06-06 ENCOUNTER — Encounter (HOSPITAL_COMMUNITY): Payer: Self-pay | Admitting: Emergency Medicine

## 2014-06-06 ENCOUNTER — Emergency Department (HOSPITAL_COMMUNITY)
Admission: EM | Admit: 2014-06-06 | Discharge: 2014-06-06 | Disposition: A | Discharge status: Home / Self Care | Payer: Self-pay | Attending: Emergency Medicine | Admitting: Emergency Medicine

## 2014-06-06 ENCOUNTER — Emergency Department (HOSPITAL_COMMUNITY): Payer: Self-pay

## 2014-06-06 DIAGNOSIS — M25421 Effusion, right elbow: Secondary | ICD-10-CM | POA: Insufficient documentation

## 2014-06-06 DIAGNOSIS — Z8659 Personal history of other mental and behavioral disorders: Secondary | ICD-10-CM | POA: Insufficient documentation

## 2014-06-06 DIAGNOSIS — Z720 Tobacco use: Secondary | ICD-10-CM | POA: Insufficient documentation

## 2014-06-06 DIAGNOSIS — R52 Pain, unspecified: Secondary | ICD-10-CM

## 2014-06-06 DIAGNOSIS — Z792 Long term (current) use of antibiotics: Secondary | ICD-10-CM | POA: Insufficient documentation

## 2014-06-06 DIAGNOSIS — M25521 Pain in right elbow: Secondary | ICD-10-CM | POA: Insufficient documentation

## 2014-06-06 DIAGNOSIS — L539 Erythematous condition, unspecified: Secondary | ICD-10-CM | POA: Insufficient documentation

## 2014-06-06 MED ORDER — MELOXICAM 7.5 MG PO TABS: 7.5000 mg | ORAL_TABLET | Freq: Every day | ORAL | Status: AC

## 2014-06-06 MED ORDER — OXYCODONE-ACETAMINOPHEN 5-325 MG PO TABS: 1.0000 | ORAL_TABLET | Freq: Four times a day (QID) | ORAL | Status: AC | PRN

## 2014-06-06 NOTE — ED Provider Notes (Signed)
Medical screening examination/treatment/procedure(s) were performed by non-physician practitioner and as supervising physician I was immediately available for consultation/collaboration.  [name], MD 06/06/14 [phone]

## 2014-06-06 NOTE — ED Notes (Signed)
Pt c/o right elbow pain after having abscess; pt [facility] same and given antibiotics with improvement but still having pain

## 2014-06-06 NOTE — Discharge Instructions (Signed)
Tennis Elbow Your caregiver has diagnosed you with a condition often referred to as "tennis elbow." This results from small tears or soreness (inflammation) at the start (origin) of the extensor muscles of the forearm. Although the condition is often called tennis or golfer's elbow, it is caused by any repetitive action performed by your elbow. HOME CARE INSTRUCTIONS  If the condition has been short lived, rest may be the only treatment required. Using your opposite hand or arm to perform the task may help. Even changing your grip may help rest the extremity. These may even prevent the condition from recurring.  Longer standing problems, however, will often be relieved faster by:  Using anti-inflammatory agents.  Applying ice packs for 30 minutes at the end of the working day, at bed time, or when activities are finished.  Your caregiver may also have you wear a splint or sling. This will allow the inflamed tendon to heal. At times, steroid injections aided with a local anesthetic will be required along with splinting for 1 to 2 weeks. Two to three steroid injections will often solve the problem. In some long standing cases, the inflamed tendon does not respond to conservative (non-surgical) therapy. Then surgery may be required to repair it. MAKE SURE YOU:   Understand these instructions.  Will watch your condition.  Will get help right away if you are not doing well or get worse. Document Released: 07/26/2005 Document Revised: 10/18/2011 Document Reviewed: 03/13/2008 ExitCare Patient Information 2015 [employer]. This information is not intended to replace advice given to you by your health care provider. Make sure you discuss any questions you have with your health care provider.  

## 2014-06-06 NOTE — ED Provider Notes (Signed)
CSN: [id number]     Arrival date & time 06/06/14  1016 History   First MD Initiated Contact with Patient 06/06/14 1031     Chief Complaint  Patient presents with  . Arm Pain     (Consider location/radiation/quality/duration/timing/severity/associated sxs/prior Treatment) Patient is a 33 y.o. female presenting with arm pain. The history is provided by the patient. No language interpreter was used.  Arm Pain This is a new problem. The problem occurs constantly. The problem has been unchanged. Associated symptoms include joint swelling and myalgias. Nothing aggravates the symptoms. She has tried nothing for the symptoms. The treatment provided moderate relief.  Pt had an abscess on dorsal aspect of elbow.  Pt reports abscess has resolved with bactrim.  Pt complains of continued elbow soreness,  Pain with moving.  Pain with shifting  Past Medical History  Diagnosis Date  . Anxiety   . Alcohol abuse   . Polysubstance abuse    Past Surgical History  Procedure Laterality Date  . Tonsillectomy     Family History  Problem Relation Age of Onset  . Diabetes Neg Hx   . Heart attack Neg Hx   . Hyperlipidemia Neg Hx   . Hypertension Neg Hx   . Sudden death Neg Hx    History  Substance Use Topics  . Smoking status: Current Some Day Smoker -- 1.00 packs/day for 15 years    Types: Cigarettes  . Smokeless tobacco: Not on file  . Alcohol Use: 0.0 oz/week     Comment: Approximatly 1/5 of liquor per day and wine also   OB History   Grav Para Term Preterm Abortions TAB SAB Ect Mult Living   6 2 2  2           Review of Systems  Musculoskeletal: Positive for joint swelling and myalgias.  All other systems reviewed and are negative.     Allergies  Bee venom  Home Medications   Prior to Admission medications   Medication Sig Start Date End Date Taking? Authorizing Provider  clindamycin (CLEOCIN) 150 MG capsule Take 2 capsules (300 mg total) by mouth every 6 (six) hours. 05/24/14    [name], MD  oxyCODONE-acetaminophen (PERCOCET) 5-325 MG per tablet Take 2 tablets by mouth every 4 (four) hours as needed. 05/26/14   Hurman HornJohn M Bednar, MD  oxyCODONE-acetaminophen (PERCOCET/ROXICET) 5-325 MG per tablet Take 1-2 tablets by mouth every 6 (six) hours as needed for severe pain. 05/24/14   [name], MD  sulfamethoxazole-trimethoprim (BACTRIM DS,SEPTRA DS) 800-160 MG per tablet Take 1 tablet by mouth 2 (two) times daily. 05/30/14 06/06/14  Geoffery Lyonsouglas Delo, MD   BP 116/41  Pulse 63  Temp(Src) 98.2 F (36.8 C) (Oral)  Resp 20  SpO2 98%  LMP 05/06/2014 Physical Exam  Nursing note and vitals reviewed. Constitutional: She is oriented to person, place, and time. She appears well-developed and well-nourished.  HENT:  Head: Normocephalic and atraumatic.  Musculoskeletal: She exhibits tenderness.  Tender right elbow,  Healed area of infection posterior area of right arm nv ns intact  Neurological: She is alert and oriented to person, place, and time. She has normal reflexes.  Skin: There is erythema.  Psychiatric: She has a normal mood and affect.    ED Course  Procedures (including critical care time) Labs Review Labs Reviewed - No data to display  Imaging Review Dg Elbow Complete Right  06/06/2014   CLINICAL DATA:  Progressive pain in the right elbow. Increased pain with  movement. Recent diagnosis of MRSA.  EXAM: RIGHT ELBOW - COMPLETE 3+ VIEW  COMPARISON:  None.  FINDINGS: The right elbow is located. There is no significant effusion. No acute bone or soft tissue abnormality is present. There is no osseous erosion.  IMPRESSION: Negative right elbow radiographs   Electronically Signed   By: Gennette Pachris  Mattern M.D.   On: 06/06/2014 11:08     EKG Interpretation None      MDM  I do not see any sign of infection  Xray of elbow is normal.   I will treat pt with meloxican and medication for pain.   Pt is advised to see her MD for recheck in 1 week   Final diagnoses:   Pain        Elson AreasLeslie K Searcy Miyoshi, PA-C 06/06/14 1122

## 2014-06-10 ENCOUNTER — Encounter (HOSPITAL_COMMUNITY): Payer: Self-pay | Admitting: Emergency Medicine

## 2014-06-10 NOTE — Discharge Planning (Signed)
[facility] was unable to see patient, [facility] orange card information will be sent to the address provided °

## 2014-09-10 IMAGING — CR DG RIBS W/ CHEST 3+V*R*
3 series · 3 of 3 positions shown · non-contrast
Comparison: PA and lateral chest 02/15/1989.

CLINICAL DATA: Status post fall 2 days ago.  Rib pain.

RIGHT RIBS AND CHEST - 3+ VIEW

[w chest pa]
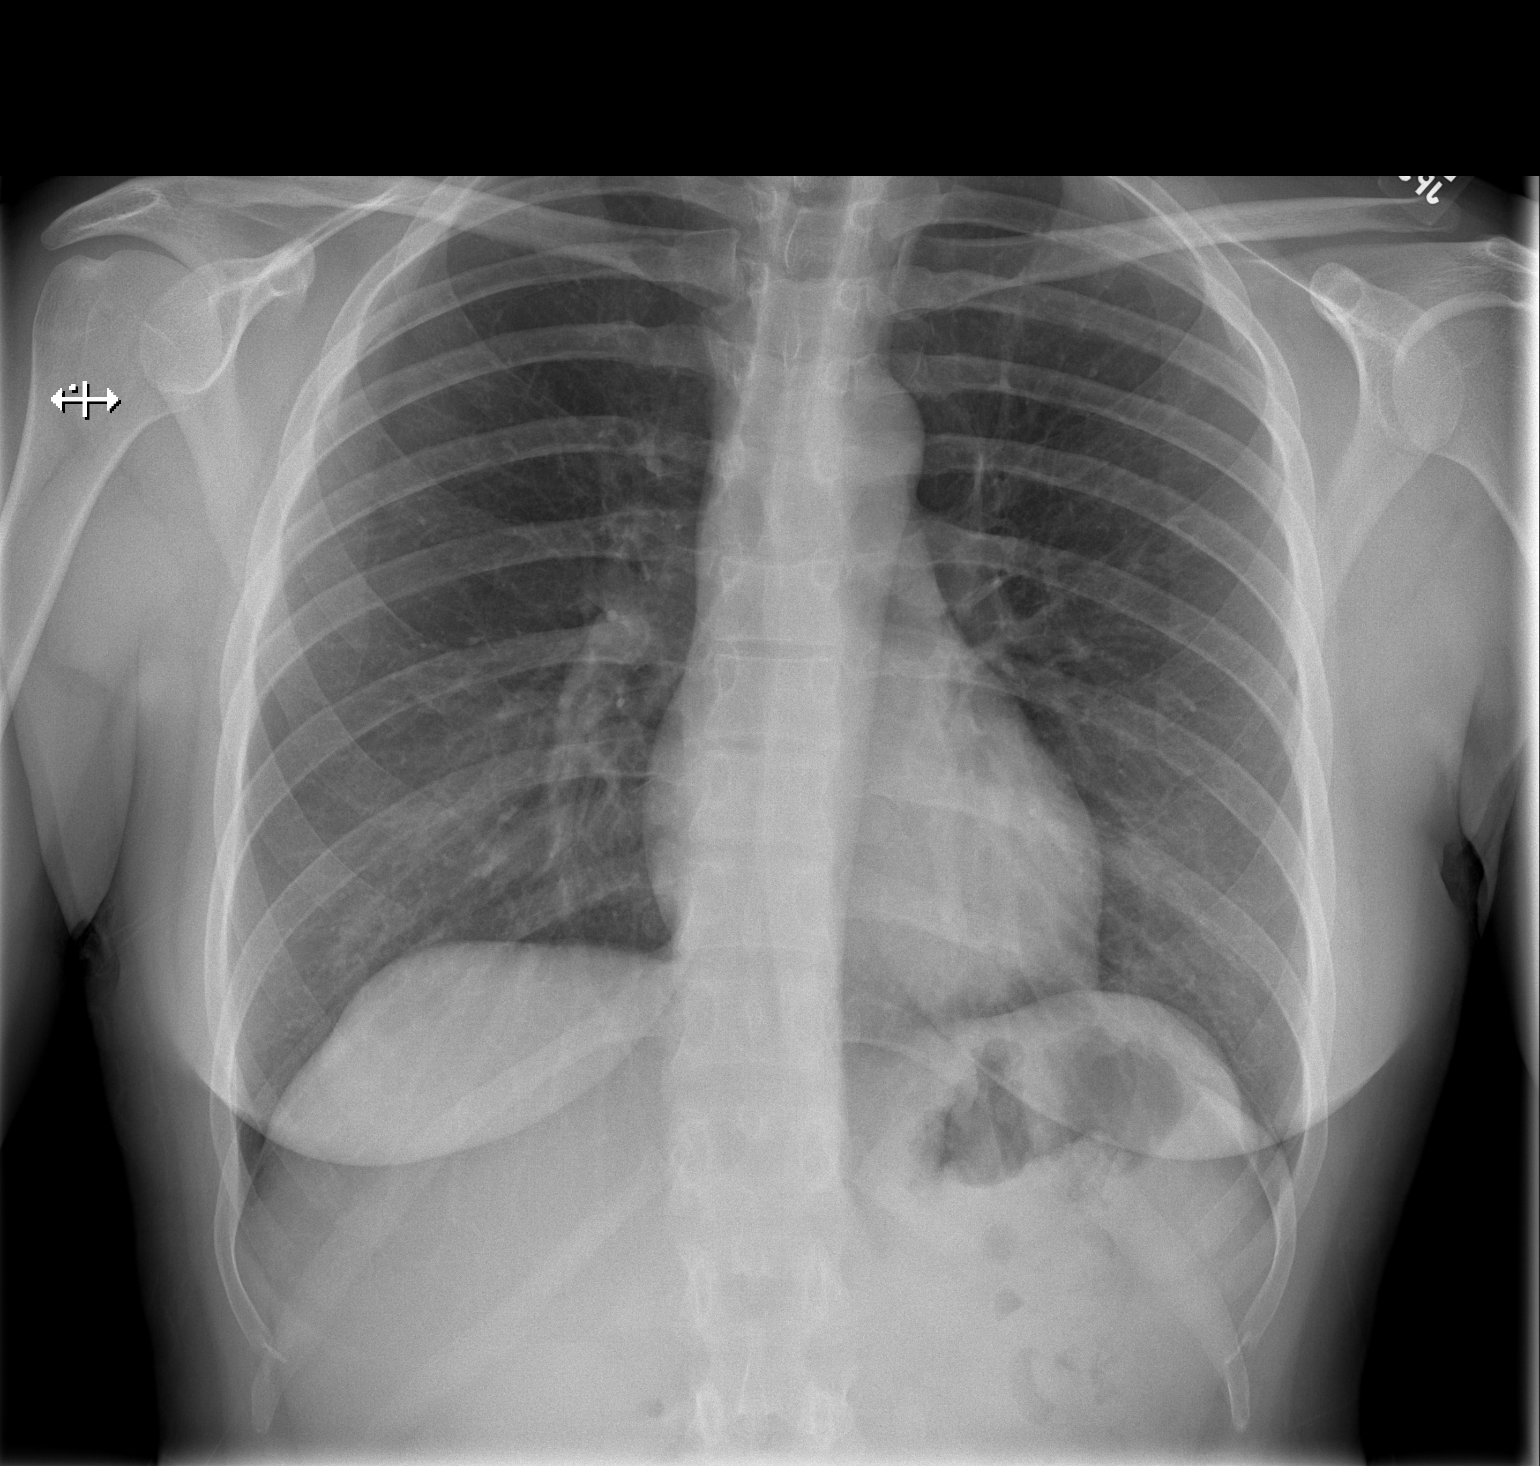

[w ribs ap lower right]
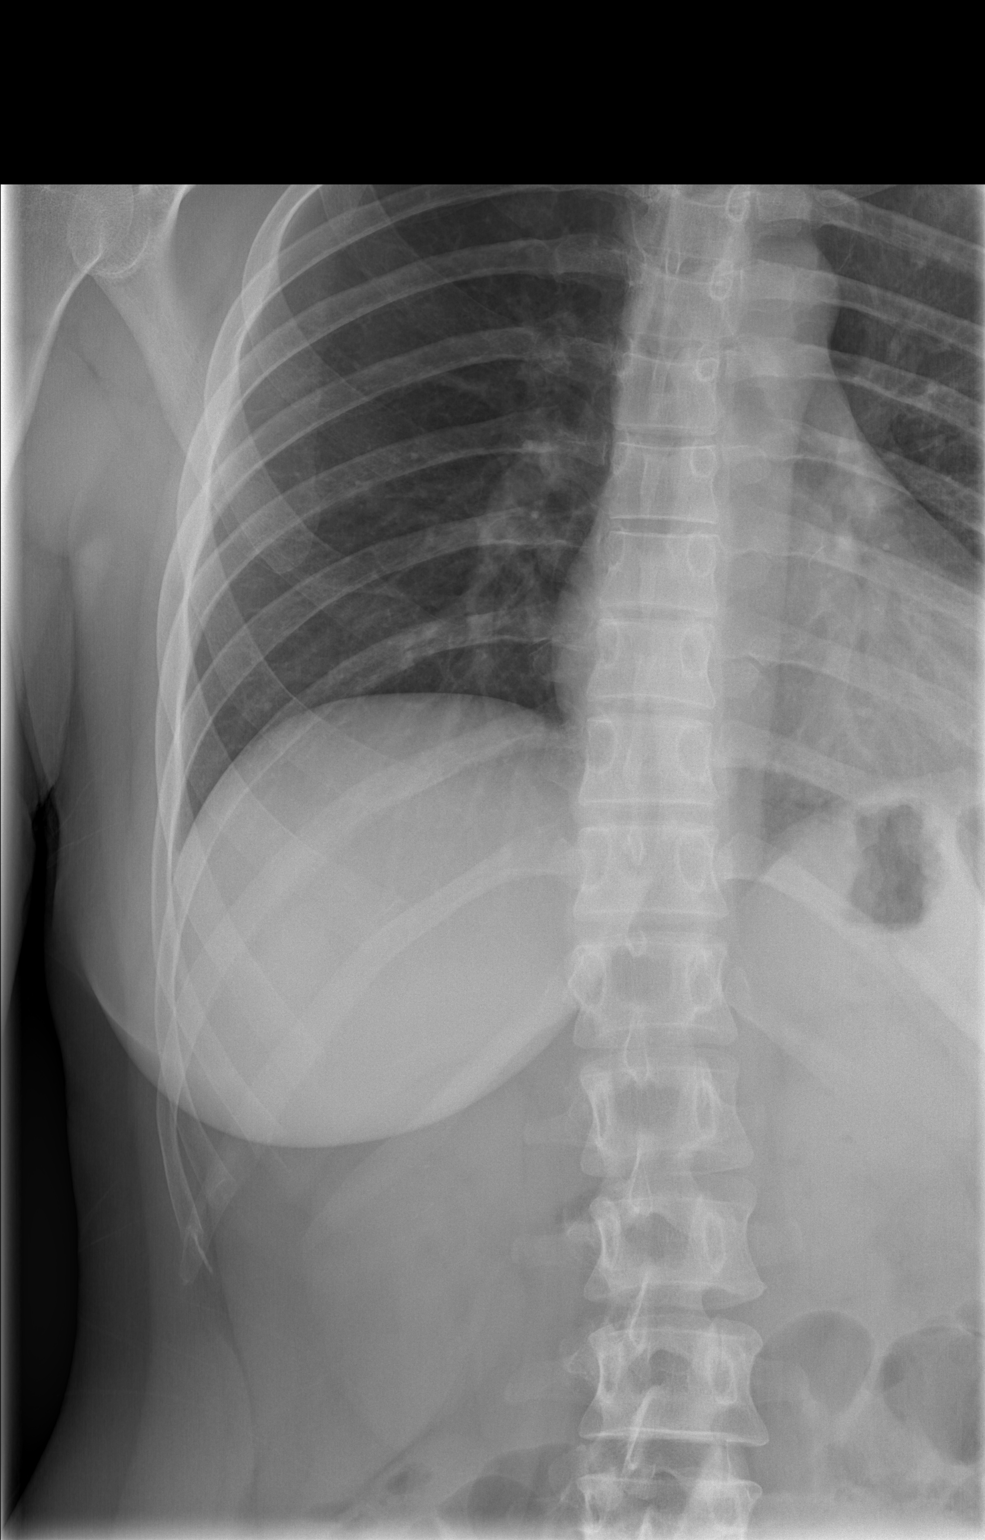

[w ribs obl right]
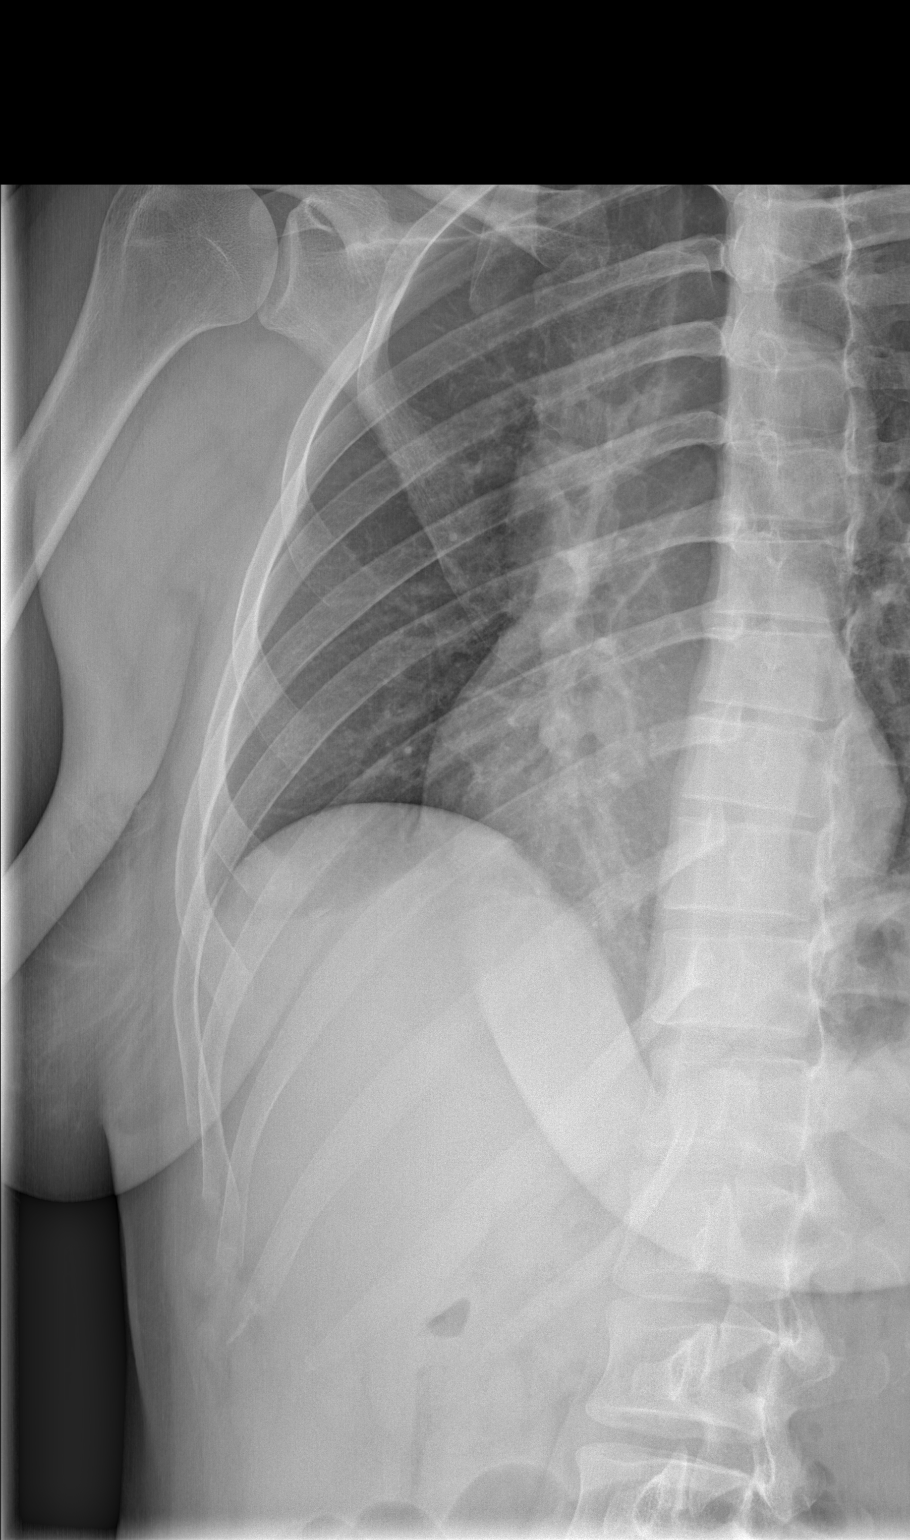

[3 of 3 positions shown; findings below may reference images not displayed]

FINDINGS: Lungs are clear.  No pneumothorax or pleural fluid.
Heart size normal.  No fracture is identified.
IMPRESSION: Negative exam.

## 2014-12-20 ENCOUNTER — Ambulatory Visit (INDEPENDENT_AMBULATORY_CARE_PROVIDER_SITE_OTHER): Payer: Self-pay | Admitting: Family Medicine

## 2014-12-20 VITALS — BP 90/60 | HR 74 | Temp 98.0°F | Resp 20 | Ht 64.25 in | Wt 149.5 lb

## 2014-12-20 DIAGNOSIS — F411 Generalized anxiety disorder: Secondary | ICD-10-CM

## 2014-12-20 DIAGNOSIS — R079 Chest pain, unspecified: Secondary | ICD-10-CM

## 2014-12-20 MED ORDER — ALPRAZOLAM 0.5 MG PO TABS: 0.2500 mg | ORAL_TABLET | ORAL | Status: AC | PRN

## 2014-12-20 MED ORDER — ESCITALOPRAM OXALATE 10 MG PO TABS: 10.0000 mg | ORAL_TABLET | Freq: Every day | ORAL | Status: AC

## 2014-12-20 NOTE — Patient Instructions (Signed)
[facility] guided meditation.

## 2014-12-20 NOTE — Progress Notes (Signed)
Subjective:    Patient ID: Kirsten [name], female    DOB: 1980-10-03, 34 y.o.   MRN: [id number]  HPI Patient presents for anxiety that has gotten worse over the past 5 days. Was diagnosed with anxiety 10 years ago, but has not been on medication now for 3-4 years. Was hosting 36 year old daughter's birthday party, when she started to feel overwhelmed and out of control. Had to rush out of home because she was scared what would happen. Feared her feelings may lead her back to the emergency room. Is feeling completely embarrassed about her behavior. Is feeling increasingly stressed and is unsure why. Feels like heart is pounding out of her chest, has pressure on her chest, and difficulty breathing.  Has been tried on multiple medications in the past including Zoloft, alprazolam, welbutrin, and a few others that she is unsure of the names. Request alprazolam today as she does not want to leave for [place] trip to [place] tomorrow and still feel this revved up.  Was going to [facility] and seeing Dr. Tiburcio PeaHarris, however, owes them money and she does not have insurance to be able to pay for appt with them.   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 Is not longer drinking alcohol at all or using illicit drugs. 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   Denies h/o heart dz, HTN, DM, MI, stroke, or acid reflux. Family h/o anxiety (mother) and diabetes.   Coping mechanisms include walking away and deep breathing.   Review of Systems  Constitutional: Negative for fever, fatigue and unexpected weight change.  Respiratory: Positive for shortness of breath (pressure). Negative for cough, chest tightness and wheezing.   Cardiovascular: Positive for chest pain and palpitations. Negative for leg swelling.  Gastrointestinal: Negative for nausea, vomiting and abdominal pain.  Neurological:  Negative for dizziness, light-headedness and headaches.  Psychiatric/Behavioral: Positive for behavioral problems. Negative for suicidal ideas, sleep disturbance, self-injury, dysphoric mood, decreased concentration and agitation. The patient is nervous/anxious. The patient is not hyperactive.       Objective:   Physical Exam  Constitutional: She is oriented to person, place, and time. She appears well-developed and well-nourished. No distress.  Blood pressure 90/60, pulse 74, temperature 98 F (36.7 C), temperature source Oral, resp. rate 20, height 5' 4.25" (1.632 m), weight 149 lb 8 oz (67.813 kg), last menstrual period 12/03/2014, SpO2 98 %.  HENT:  Head: Normocephalic and atraumatic.  Right Ear: External ear normal.  Left Ear: External ear normal.  Eyes: Conjunctivae are normal. Pupils are equal, round, and reactive to light. Right eye exhibits no discharge. Left eye exhibits no discharge.  Cardiovascular: Normal rate, regular rhythm and normal heart sounds.  Exam reveals no gallop and no friction rub.   No murmur heard. Pulmonary/Chest: Effort normal and breath sounds normal. No respiratory distress. She has no wheezes. She has no rales.  Abdominal: Soft. Bowel sounds are normal. She exhibits no distension and no mass. There is no tenderness. There is no rebound and no guarding.  Neurological: She is alert and oriented to person, place, and time.  Skin: Skin is warm and dry. No rash noted. She is not diaphoretic. No erythema. No pallor.  Psychiatric: Her speech is normal. Thought content normal. Her mood appears anxious. Her affect is not angry, not blunt and  not labile. She is not agitated, not aggressive, not hyperactive, not slowed, not withdrawn, not actively hallucinating and not combative. She does not exhibit a depressed mood.  Disheveled appearance.  She is attentive.   EKG with Dr. Patsy Lageropland: Bradycardia otherwise normal.     Assessment & Plan:  1. Chest pain, unspecified  chest pain type Likely related to anxiety versus heart in origin. - EKG 12-Lead  2. Anxiety state Patient rushed me and CMA entire interview, however, wanted to pause and text on her phone. Should return to clinic in 4 weeks for re-eval and to check how doing with Lexapro. Xanax should last for entire month as it is only prn. Will not re-order before 1 month or if does not come back for re-eval. - escitalopram (LEXAPRO) 10 MG tablet; Take 1 tablet (10 mg total) by mouth daily.  Dispense: 30 tablet; Refill: 1 - ALPRAZolam (XANAX) 0.5 MG tablet; Take 0.5-1 tablets (0.25-0.5 mg total) by mouth as needed for anxiety.  Dispense: 10 tablet; Refill: 0   Joeleen Wortley PA-C  [facility] 12/20/2014 3:15 PM

## 2014-12-21 NOTE — Progress Notes (Signed)
Discussed with Ms. Kirsten [name].  Pt denies any current CP.  Reviewed EKG and compared with prior.  She does have downgoing T wave in V2 but this was also present on EKG from 2012.  Otherwise no acute or concerning change notes and pts sx most consistent with panic attack.  Given her age and lack of other risk factors (except smoking) do not suspect CAD but she is asked to seek care if her sx continue.    BP Readings from Last 3 Encounters:  12/20/14 90/60  06/06/14 101/69  05/30/14 100/59

## 2015-01-13 ENCOUNTER — Emergency Department (HOSPITAL_BASED_OUTPATIENT_CLINIC_OR_DEPARTMENT_OTHER)
Admission: EM | Admit: 2015-01-13 | Discharge: 2015-01-13 | Discharge status: Against Medical Advice | Payer: Self-pay | Attending: Emergency Medicine | Admitting: Emergency Medicine

## 2015-01-13 DIAGNOSIS — Z720 Tobacco use: Secondary | ICD-10-CM | POA: Insufficient documentation

## 2015-01-13 DIAGNOSIS — F419 Anxiety disorder, unspecified: Secondary | ICD-10-CM | POA: Insufficient documentation

## 2015-01-13 NOTE — ED Notes (Signed)
Correction to first set of vitals, was my mistake entering 76% when I meant 96% o2 on room air.

## 2015-01-16 ENCOUNTER — Emergency Department (HOSPITAL_COMMUNITY)
Admission: EM | Admit: 2015-01-16 | Discharge: 2015-01-16 | Disposition: A | Discharge status: Home / Self Care | Payer: Self-pay | Attending: Emergency Medicine | Admitting: Emergency Medicine

## 2015-01-16 ENCOUNTER — Encounter (HOSPITAL_COMMUNITY): Payer: Self-pay | Admitting: Emergency Medicine

## 2015-01-16 DIAGNOSIS — F410 Panic disorder [episodic paroxysmal anxiety] without agoraphobia: Secondary | ICD-10-CM | POA: Insufficient documentation

## 2015-01-16 DIAGNOSIS — R0602 Shortness of breath: Secondary | ICD-10-CM | POA: Insufficient documentation

## 2015-01-16 DIAGNOSIS — Z8659 Personal history of other mental and behavioral disorders: Secondary | ICD-10-CM

## 2015-01-16 DIAGNOSIS — R079 Chest pain, unspecified: Secondary | ICD-10-CM | POA: Insufficient documentation

## 2015-01-16 DIAGNOSIS — Z79899 Other long term (current) drug therapy: Secondary | ICD-10-CM | POA: Insufficient documentation

## 2015-01-16 DIAGNOSIS — Z720 Tobacco use: Secondary | ICD-10-CM | POA: Insufficient documentation

## 2015-01-16 NOTE — ED Notes (Signed)
Pt able to get a hold of her fiance using her cellphone.

## 2015-01-16 NOTE — ED Provider Notes (Signed)
CSN: [id number]     Arrival date & time 01/16/15  1549 History  This chart was scribed for non-physician practitioner Fayrene Helper, PA, working with Eber Hong, MD, by Tanda Rockers, ED Scribe. This patient was seen in room WTR8/WTR8 and the patient's care was started at 3:54 PM.     Chief Complaint  Patient presents with  . Panic Attack   The history is provided by the patient. No language interpreter was used.     HPI Comments: Kirsten [name] is a 34 y.o. female brought in by ambulance, with hx anxiety who presents to the Emergency Department complaining of a panic attack that occurred today. She mentions that she has been having multiple panic attacks recently. She cannot say what triggers the attacks but denies anything that occurred recently that exacerbated her symptoms. Pt did take 3 Xanax earlier. Pt states that she is having chest pain and shortness of breath which is normal when she has a panic attack.  She mentions that recently any pressure applied to her by another person, such as her daughter laying on her or her fiance placing an arm on her while sleeping, has been causing her discomfort. She was seen by PCP on Monday, 01/13/2015 (3 days ago) for worsening panic attacks. She had EKG done at that time due to having abnormal heart rate. Pt states that she was told something was abnormal on the EKG but claims she was not told to follow up. Denies illicit drug use or EtOH use. Denies SI, HI, visual or auditory hallucinations.  PCP - Tiburcio Pea   Past Medical History  Diagnosis Date  . Anxiety   . Alcohol abuse   . Polysubstance abuse    Past Surgical History  Procedure Laterality Date  . Tonsillectomy     Family History  Problem Relation Age of Onset  . Diabetes Neg Hx   . Heart attack Neg Hx   . Hyperlipidemia Neg Hx   . Hypertension Neg Hx   . Sudden death Neg Hx    History  Substance Use Topics  . Smoking status: Current Some Day Smoker -- 0.25 packs/day for 15 years     Types: Cigarettes  . Smokeless tobacco: Never Used  . Alcohol Use: 0.0 oz/week    0 Standard drinks or equivalent per week     Comment: Approximatly 1/5 of liquor per day and wine also   OB History    Gravida Para Term Preterm AB TAB SAB Ectopic Multiple Living   6 2 2  2           Review of Systems  Respiratory: Positive for shortness of breath.   Cardiovascular: Positive for chest pain.  Psychiatric/Behavioral: The patient is nervous/anxious.       Allergies  Bee venom  Home Medications   Prior to Admission medications   Medication Sig Start Date End Date Taking? Authorizing Provider  ALPRAZolam Prudy Feeler) 0.5 MG tablet Take 0.5-1 tablets (0.25-0.5 mg total) by mouth as needed for anxiety. 12/20/14   Tishira R Brewington, PA-C  clindamycin (CLEOCIN) 150 MG capsule Take 2 capsules (300 mg total) by mouth every 6 (six) hours. Patient not taking: Reported on 12/20/2014 05/24/14   Jerelyn Scott, MD  escitalopram (LEXAPRO) 10 MG tablet Take 1 tablet (10 mg total) by mouth daily. 12/20/14   Tishira R Brewington, PA-C  meloxicam (MOBIC) 7.5 MG tablet Take 1 tablet (7.5 mg total) by mouth daily. Patient not taking: Reported on 12/20/2014 06/06/14   Verlon Au  K Sofia, PA-C  oxyCODONE-acetaminophen (PERCOCET) 5-325 MG per tablet Take 2 tablets by mouth every 4 (four) hours as needed. Patient not taking: Reported on 12/20/2014 05/26/14   Wayland Salinas, MD  oxyCODONE-acetaminophen (PERCOCET/ROXICET) 5-325 MG per tablet Take 1-2 tablets by mouth every 6 (six) hours as needed for severe pain. Patient not taking: Reported on 12/20/2014 06/06/14   Elson Areas, PA-C   Triage Vitals: BP 117/62 mmHg  Pulse 82  Temp(Src) 98.6 F (37 C) (Oral)  Resp 18  SpO2 98%  LMP 01/02/2015   Physical Exam  Constitutional: She is oriented to person, place, and time. She appears well-developed and well-nourished. No distress.  HENT:  Head: Normocephalic and atraumatic.  Eyes: Conjunctivae and EOM are normal.  Pupils are equal, round, and reactive to light.  Neck: Neck supple. No tracheal deviation present.  Cardiovascular: Normal rate, regular rhythm and normal heart sounds.  Exam reveals no gallop and no friction rub.   No murmur heard. Pulmonary/Chest: Effort normal and breath sounds normal. No respiratory distress. She has no wheezes. She has no rhonchi. She has no rales.  Abdominal: Soft. Bowel sounds are normal. There is no tenderness.  Musculoskeletal: Normal range of motion.  Neurological: She is alert and oriented to person, place, and time.  Patient appears anxious yet drowsy, difficult to maintain focus. However she has no focal neuro deficit.  Skin: Skin is warm and dry.  Psychiatric: She has a normal mood and affect. Her behavior is normal.  Nursing note and vitals reviewed.   ED Course  Procedures (including critical care time)  DIAGNOSTIC STUDIES: Oxygen Saturation is 98% on RA, normal by my interpretation.    COORDINATION OF CARE: 4:10 PM-patient with history of panic attack here complaining of chest discomfort whenever her son or her fianc place the arms on her chest. She has no SI/HI/hallucination. She has been evaluated by her PCP for this problem according to her. I recommend patient to follow-up with her doctor for further care and for continuation of care. I do not think she has any acute emergent medical problem today. For low suspicion for ACS, PE, or other life-threatening emergency.  Labs Review Labs Reviewed - No data to display  Imaging Review No results found.   EKG Interpretation None      MDM   Final diagnoses:  History of panic attacks   BP 117/62 mmHg  Pulse 82  Temp(Src) 98.6 F (37 C) (Oral)  Resp 18  SpO2 98%  LMP 01/02/2015   I personally performed the services described in this documentation, which was scribed in my presence. The recorded information has been reviewed and is accurate.       Fayrene Helper, PA-C 01/16/15 1616  Eber Hong, MD 01/17/15 [phone]

## 2015-01-16 NOTE — Discharge Instructions (Signed)
Please follow up closely with your doctor for further evaluation of your condition.   Panic Attacks Panic attacks are sudden, short-livedsurges of severe anxiety, fear, or discomfort. They may occur for no reason when you are relaxed, when you are anxious, or when you are sleeping. Panic attacks may occur for a number of reasons:   Healthy people occasionally have panic attacks in extreme, life-threatening situations, such as war or natural disasters. Normal anxiety is a protective mechanism of the body that helps [place] react to danger (fight or flight response).  Panic attacks are often seen with anxiety disorders, such as panic disorder, social anxiety disorder, generalized anxiety disorder, and phobias. Anxiety disorders cause excessive or uncontrollable anxiety. They may interfere with your relationships or other life activities.  Panic attacks are sometimes seen with other mental illnesses, such as depression and posttraumatic stress disorder.  Certain medical conditions, prescription medicines, and drugs of abuse can cause panic attacks. SYMPTOMS  Panic attacks start suddenly, peak within 20 minutes, and are accompanied by four or more of the following symptoms:  Pounding heart or fast heart rate (palpitations).  Sweating.  Trembling or shaking.  Shortness of breath or feeling smothered.  Feeling choked.  Chest pain or discomfort.  Nausea or strange feeling in your stomach.  Dizziness, light-headedness, or feeling like you will faint.  Chills or hot flushes.  Numbness or tingling in your lips or hands and feet.  Feeling that things are not real or feeling that you are not yourself.  Fear of losing control or going crazy.  Fear of dying. Some of these symptoms can mimic serious medical conditions. For example, you may think you are having a heart attack. Although panic attacks can be very scary, they are not life threatening. DIAGNOSIS  Panic attacks are diagnosed through  an assessment by your health care provider. Your health care provider will ask questions about your symptoms, such as where and when they occurred. Your health care provider will also ask about your medical history and use of alcohol and drugs, including prescription medicines. Your health care provider may order blood tests or other studies to rule out a serious medical condition. Your health care provider may refer you to a mental health professional for further evaluation. TREATMENT   Most healthy people who have one or two panic attacks in an extreme, life-threatening situation will not require treatment.  The treatment for panic attacks associated with anxiety disorders or other mental illness typically involves counseling with a mental health professional, medicine, or a combination of both. Your health care provider will help determine what treatment is best for you.  Panic attacks due to physical illness usually go away with treatment of the illness. If prescription medicine is causing panic attacks, talk with your health care provider about stopping the medicine, decreasing the dose, or substituting another medicine.  Panic attacks due to alcohol or drug abuse go away with abstinence. Some adults need professional help in order to stop drinking or using drugs. HOME CARE INSTRUCTIONS   Take all medicines as directed by your health care provider.   Schedule and attend follow-up visits as directed by your health care provider. It is important to keep all your appointments. SEEK MEDICAL CARE IF:  You are not able to take your medicines as prescribed.  Your symptoms do not improve or get worse. SEEK IMMEDIATE MEDICAL CARE IF:   You experience panic attack symptoms that are different than your usual symptoms.  You have serious  thoughts about hurting yourself or others.  You are taking medicine for panic attacks and have a serious side effect. MAKE SURE YOU:  Understand these  instructions.  Will watch your condition.  Will get help right away if you are not doing well or get worse. Document Released: 07/26/2005 Document Revised: 07/31/2013 Document Reviewed: 03/09/2013 [facility] Patient Information 2015 [place], [place]. This information is not intended to replace advice given to you by your health care provider. Make sure you discuss any questions you have with your health care provider.

## 2015-01-16 NOTE — ED Notes (Signed)
Have called pt's fiance 3 times for transport with no answer.  Pt aware

## 2015-01-16 NOTE — ED Notes (Signed)
[employer] at bedside 
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
## 2015-01-16 NOTE — ED Notes (Signed)
Pt will call her fiance for transport to home

## 2015-02-07 DEATH — deceased

## 2015-06-09 ENCOUNTER — Encounter: Payer: Self-pay | Admitting: Family Medicine

## 2015-06-10 DEATH — deceased
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
